# Patient Record
Sex: Female | Born: 1945 | Race: White | Hispanic: No | Marital: Single | Smoking: Never smoker
Health system: Southern US, Community
[De-identification: ages and names within clinical notes are randomized; demographics above are authoritative.]

## PROBLEM LIST (undated history)

## (undated) DIAGNOSIS — I10 Essential (primary) hypertension: Secondary | ICD-10-CM

## (undated) DIAGNOSIS — E039 Hypothyroidism, unspecified: Secondary | ICD-10-CM

## (undated) DIAGNOSIS — E119 Type 2 diabetes mellitus without complications: Secondary | ICD-10-CM

## (undated) DIAGNOSIS — E785 Hyperlipidemia, unspecified: Secondary | ICD-10-CM

## (undated) DIAGNOSIS — M8580 Other specified disorders of bone density and structure, unspecified site: Secondary | ICD-10-CM

## (undated) HISTORY — DX: Other specified disorders of bone density and structure, unspecified site: M85.80

## (undated) HISTORY — DX: Essential (primary) hypertension: I10

## (undated) HISTORY — DX: Hypothyroidism, unspecified: E03.9

## (undated) HISTORY — DX: Type 2 diabetes mellitus without complications: E11.9

## (undated) HISTORY — DX: Hyperlipidemia, unspecified: E78.5

## (undated) HISTORY — PX: TUBAL LIGATION: SHX77

---

## 1997-10-15 ENCOUNTER — Ambulatory Visit (HOSPITAL_COMMUNITY): Admission: RE | Admit: 1997-10-15 | Discharge: 1997-10-15 | Payer: Self-pay | Admitting: Obstetrics and Gynecology

## 1998-04-22 ENCOUNTER — Ambulatory Visit (HOSPITAL_COMMUNITY): Admission: RE | Admit: 1998-04-22 | Discharge: 1998-04-22 | Payer: Self-pay

## 1999-04-28 ENCOUNTER — Ambulatory Visit (HOSPITAL_COMMUNITY): Admission: RE | Admit: 1999-04-28 | Discharge: 1999-04-28 | Payer: Self-pay | Admitting: *Deleted

## 2000-04-29 ENCOUNTER — Ambulatory Visit (HOSPITAL_COMMUNITY): Admission: RE | Admit: 2000-04-29 | Discharge: 2000-04-29 | Payer: Self-pay

## 2001-05-02 ENCOUNTER — Ambulatory Visit (HOSPITAL_COMMUNITY): Admission: RE | Admit: 2001-05-02 | Discharge: 2001-05-02 | Payer: Self-pay | Admitting: Obstetrics and Gynecology

## 2001-05-02 ENCOUNTER — Encounter: Payer: Self-pay | Admitting: Obstetrics and Gynecology

## 2002-05-08 ENCOUNTER — Ambulatory Visit (HOSPITAL_COMMUNITY): Admission: RE | Admit: 2002-05-08 | Discharge: 2002-05-08 | Payer: Self-pay | Admitting: Obstetrics and Gynecology

## 2002-05-08 ENCOUNTER — Encounter: Payer: Self-pay | Admitting: Obstetrics and Gynecology

## 2003-05-21 ENCOUNTER — Ambulatory Visit (HOSPITAL_COMMUNITY): Admission: RE | Admit: 2003-05-21 | Discharge: 2003-05-21 | Payer: Self-pay | Admitting: Obstetrics and Gynecology

## 2004-05-22 ENCOUNTER — Ambulatory Visit (HOSPITAL_COMMUNITY): Admission: RE | Admit: 2004-05-22 | Discharge: 2004-05-22 | Payer: Self-pay | Admitting: Obstetrics and Gynecology

## 2005-06-05 ENCOUNTER — Ambulatory Visit (HOSPITAL_COMMUNITY): Admission: RE | Admit: 2005-06-05 | Discharge: 2005-06-05 | Payer: Self-pay

## 2006-02-13 ENCOUNTER — Ambulatory Visit (HOSPITAL_BASED_OUTPATIENT_CLINIC_OR_DEPARTMENT_OTHER): Admission: RE | Admit: 2006-02-13 | Discharge: 2006-02-13 | Payer: Self-pay | Admitting: Orthopedic Surgery

## 2006-02-13 ENCOUNTER — Encounter (INDEPENDENT_AMBULATORY_CARE_PROVIDER_SITE_OTHER): Payer: Self-pay | Admitting: Specialist

## 2006-06-14 ENCOUNTER — Ambulatory Visit (HOSPITAL_COMMUNITY): Admission: RE | Admit: 2006-06-14 | Discharge: 2006-06-14 | Payer: Self-pay | Admitting: Obstetrics & Gynecology

## 2007-06-16 ENCOUNTER — Ambulatory Visit (HOSPITAL_COMMUNITY): Admission: RE | Admit: 2007-06-16 | Discharge: 2007-06-16 | Payer: Self-pay | Admitting: Family Medicine

## 2008-06-17 ENCOUNTER — Ambulatory Visit (HOSPITAL_COMMUNITY): Admission: RE | Admit: 2008-06-17 | Discharge: 2008-06-17 | Payer: Self-pay | Admitting: Family Medicine

## 2009-06-21 ENCOUNTER — Ambulatory Visit (HOSPITAL_COMMUNITY): Admission: RE | Admit: 2009-06-21 | Discharge: 2009-06-21 | Payer: Self-pay | Admitting: Family Medicine

## 2010-06-27 ENCOUNTER — Ambulatory Visit (HOSPITAL_COMMUNITY)
Admission: RE | Admit: 2010-06-27 | Discharge: 2010-06-27 | Payer: Self-pay | Source: Home / Self Care | Attending: Family Medicine | Admitting: Family Medicine

## 2010-11-17 NOTE — Op Note (Signed)
NAMESEIRA, CODY                 ACCOUNT NO.:  [id number]   MEDICAL RECORD NO.:  [id number]          PATIENT TYPE:  AMB   LOCATION:  DSC                          FACILITY:  [facility]   PHYSICIAN:  Cindee Salt, M.D.       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   OPERATION:  Excisoin of cyst left ring finger with debridement proximal  interphalangeal joint, debridement proximal interphalangeal joint  osteophytes left middle finger.   SURGEON:  Cindee Salt, M.D.   ASSISTANT:  R.N.   ANESTHESIA:  General.   DATE OF OPERATION:  February 13, 2006.   HISTORY:  The patient is a 65 year old female with a history of mass over  the PIP joints of each of the two fingers.  The middle finger has decreased  in size.  The ring finger remains quite large.  She is desirous of removal,  being well aware of degenerative changes, possibility of recurrence,  stiffness, infection.  In the preoperative area, questions are encouraged  and answered.  The area of incision marked by both patient and surgeon.   PROCEDURE:  The patient was brought to the operating room where a general  anesthetic was carried out without difficulty.  She was prepped using  Duraprep in supine position, left arm free.  After 3-minute dry time, she  was draped.  The limb was exsanguinated with an Esmarch bandage.  Tourniquet  placed on the upper arm was inflated 250 mmHg.  A straight incision was made  longitudinally over the PIP joint of her left ring finger, carried down  through subcutaneous tissue.  Bleeders were electrocauterized.  A cystic  mass was immediately encountered.  With blunt and  sharp dissection, this was  dissected free, found to arise from a hole in the extensor tendon to the  radial side of the central slip.  This area was opened.  A small osteophyte  was found to protrude through the area of defect.  This was removed with  rongeur.  A synovectomy of the joint was performed.  No further lesions were  identified.  The wound was irrigated.  The extensor tendon was repaired with  a running 5-0 chromic suture.  The skin was repaired with interrupted 5-0  nylon sutures.  Similarly, a straight incision was then made over the middle  finger, carried down through subcutaneous tissue.  Again, bleeders were  electrocauterized with bipolar.  No discreet cyst was found but attention of  the extensor was noted.  An area of probable deflation of the cyst was also  noted.  The extensor was then opened on its radial aspect between the  lumbrical and central slip.  A large osteophyte was present on the radial  condyle.  This was removed and smoothed with a  rongeur.  A small osteophyte  was present on the ulnar condyle.  This was also removed.  This was sent to  pathology.  The remainder of the cartilage appeared intact.  The wound was  irrigated.  The extensor tendon incision was repaired with a running 5-0  chromic suture and the skin  with interrupted 5-0 nylon sutures.  Sterile compressive dressing and splint  to each finger applied.  The patient tolerated the procedure well and was  taken to the recovery room for observation in satisfactory condition.  She  is discharged home to return to the [facility] in one week on  Vicodin.           ______________________________  Cindee Salt, M.D.     GK/MEDQ  D:  02/13/2006  T:  02/13/2006  Job:  [id number]

## 2011-05-28 ENCOUNTER — Other Ambulatory Visit (HOSPITAL_COMMUNITY): Payer: Self-pay | Admitting: Family Medicine

## 2011-05-28 DIAGNOSIS — Z1231 Encounter for screening mammogram for malignant neoplasm of breast: Secondary | ICD-10-CM

## 2011-07-02 ENCOUNTER — Ambulatory Visit (HOSPITAL_COMMUNITY)
Admission: RE | Admit: 2011-07-02 | Discharge: 2011-07-02 | Disposition: A | Discharge status: Home / Self Care | Payer: BC Managed Care – PPO | Source: Ambulatory Visit | Attending: Family Medicine | Admitting: Family Medicine

## 2011-07-02 DIAGNOSIS — Z1231 Encounter for screening mammogram for malignant neoplasm of breast: Secondary | ICD-10-CM

## 2012-02-07 ENCOUNTER — Other Ambulatory Visit (HOSPITAL_COMMUNITY): Payer: Self-pay | Admitting: Family Medicine

## 2012-02-07 DIAGNOSIS — N959 Unspecified menopausal and perimenopausal disorder: Secondary | ICD-10-CM

## 2012-02-07 DIAGNOSIS — Z1231 Encounter for screening mammogram for malignant neoplasm of breast: Secondary | ICD-10-CM

## 2012-07-03 ENCOUNTER — Ambulatory Visit (HOSPITAL_COMMUNITY)
Admission: RE | Admit: 2012-07-03 | Discharge: 2012-07-03 | Disposition: A | Discharge status: Home / Self Care | Payer: Medicare Other | Source: Ambulatory Visit | Attending: Family Medicine | Admitting: Family Medicine

## 2012-07-03 DIAGNOSIS — Z13820 Encounter for screening for osteoporosis: Secondary | ICD-10-CM | POA: Insufficient documentation

## 2012-07-03 DIAGNOSIS — Z780 Asymptomatic menopausal state: Secondary | ICD-10-CM | POA: Insufficient documentation

## 2012-07-03 DIAGNOSIS — Z1231 Encounter for screening mammogram for malignant neoplasm of breast: Secondary | ICD-10-CM | POA: Insufficient documentation

## 2012-07-03 DIAGNOSIS — N959 Unspecified menopausal and perimenopausal disorder: Secondary | ICD-10-CM

## 2013-06-02 ENCOUNTER — Other Ambulatory Visit (HOSPITAL_COMMUNITY): Payer: Self-pay | Admitting: Family Medicine

## 2013-06-02 DIAGNOSIS — Z1231 Encounter for screening mammogram for malignant neoplasm of breast: Secondary | ICD-10-CM

## 2013-07-06 ENCOUNTER — Ambulatory Visit (HOSPITAL_COMMUNITY)
Admission: RE | Admit: 2013-07-06 | Discharge: 2013-07-06 | Disposition: A | Discharge status: Home / Self Care | Payer: Medicare HMO | Source: Ambulatory Visit | Attending: Family Medicine | Admitting: Family Medicine

## 2013-07-06 DIAGNOSIS — Z1231 Encounter for screening mammogram for malignant neoplasm of breast: Secondary | ICD-10-CM

## 2014-06-07 ENCOUNTER — Other Ambulatory Visit (HOSPITAL_COMMUNITY): Payer: Self-pay | Admitting: Family Medicine

## 2014-06-07 DIAGNOSIS — Z1231 Encounter for screening mammogram for malignant neoplasm of breast: Secondary | ICD-10-CM

## 2014-07-08 ENCOUNTER — Ambulatory Visit (HOSPITAL_COMMUNITY)
Admission: RE | Admit: 2014-07-08 | Discharge: 2014-07-08 | Disposition: A | Discharge status: Home / Self Care | Payer: Medicare HMO | Source: Ambulatory Visit | Attending: Family Medicine | Admitting: Family Medicine

## 2014-07-08 DIAGNOSIS — Z1231 Encounter for screening mammogram for malignant neoplasm of breast: Secondary | ICD-10-CM | POA: Diagnosis not present

## 2015-07-13 ENCOUNTER — Other Ambulatory Visit: Payer: Self-pay

## 2015-07-13 DIAGNOSIS — Z1231 Encounter for screening mammogram for malignant neoplasm of breast: Secondary | ICD-10-CM

## 2015-07-14 ENCOUNTER — Ambulatory Visit
Admission: RE | Admit: 2015-07-14 | Discharge: 2015-07-14 | Disposition: A | Discharge status: Home / Self Care | Payer: PPO | Source: Ambulatory Visit

## 2015-07-14 DIAGNOSIS — Z1231 Encounter for screening mammogram for malignant neoplasm of breast: Secondary | ICD-10-CM

## 2015-10-11 DIAGNOSIS — E119 Type 2 diabetes mellitus without complications: Secondary | ICD-10-CM | POA: Diagnosis not present

## 2015-10-11 DIAGNOSIS — E785 Hyperlipidemia, unspecified: Secondary | ICD-10-CM | POA: Diagnosis not present

## 2015-10-11 DIAGNOSIS — Z298 Encounter for other specified prophylactic measures: Secondary | ICD-10-CM | POA: Diagnosis not present

## 2015-10-11 DIAGNOSIS — E039 Hypothyroidism, unspecified: Secondary | ICD-10-CM | POA: Diagnosis not present

## 2016-06-01 DIAGNOSIS — H5203 Hypermetropia, bilateral: Secondary | ICD-10-CM | POA: Diagnosis not present

## 2016-06-01 DIAGNOSIS — H2511 Age-related nuclear cataract, right eye: Secondary | ICD-10-CM | POA: Diagnosis not present

## 2016-06-11 DIAGNOSIS — R221 Localized swelling, mass and lump, neck: Secondary | ICD-10-CM | POA: Diagnosis not present

## 2016-06-11 DIAGNOSIS — L0211 Cutaneous abscess of neck: Secondary | ICD-10-CM | POA: Diagnosis not present

## 2016-06-11 DIAGNOSIS — Z23 Encounter for immunization: Secondary | ICD-10-CM | POA: Diagnosis not present

## 2016-06-11 DIAGNOSIS — E119 Type 2 diabetes mellitus without complications: Secondary | ICD-10-CM | POA: Diagnosis not present

## 2016-06-20 DIAGNOSIS — N179 Acute kidney failure, unspecified: Secondary | ICD-10-CM | POA: Diagnosis not present

## 2016-06-20 DIAGNOSIS — E119 Type 2 diabetes mellitus without complications: Secondary | ICD-10-CM | POA: Diagnosis not present

## 2016-06-22 DIAGNOSIS — L02215 Cutaneous abscess of perineum: Secondary | ICD-10-CM | POA: Diagnosis not present

## 2016-06-22 DIAGNOSIS — E119 Type 2 diabetes mellitus without complications: Secondary | ICD-10-CM | POA: Diagnosis not present

## 2016-07-24 ENCOUNTER — Other Ambulatory Visit: Payer: Self-pay | Admitting: Family Medicine

## 2016-07-24 DIAGNOSIS — Z1231 Encounter for screening mammogram for malignant neoplasm of breast: Secondary | ICD-10-CM

## 2016-08-01 ENCOUNTER — Ambulatory Visit
Admission: RE | Admit: 2016-08-01 | Discharge: 2016-08-01 | Disposition: A | Discharge status: Home / Self Care | Payer: PPO | Source: Ambulatory Visit | Attending: Family Medicine | Admitting: Family Medicine

## 2016-08-01 DIAGNOSIS — Z1231 Encounter for screening mammogram for malignant neoplasm of breast: Secondary | ICD-10-CM

## 2016-10-12 DIAGNOSIS — E039 Hypothyroidism, unspecified: Secondary | ICD-10-CM | POA: Diagnosis not present

## 2016-10-12 DIAGNOSIS — Z0000 Encounter for general adult medical examination without abnormal findings: Secondary | ICD-10-CM | POA: Diagnosis not present

## 2016-10-12 DIAGNOSIS — E119 Type 2 diabetes mellitus without complications: Secondary | ICD-10-CM | POA: Diagnosis not present

## 2016-11-09 DIAGNOSIS — E119 Type 2 diabetes mellitus without complications: Secondary | ICD-10-CM | POA: Diagnosis not present

## 2016-11-09 DIAGNOSIS — F419 Anxiety disorder, unspecified: Secondary | ICD-10-CM | POA: Diagnosis not present

## 2016-11-09 DIAGNOSIS — F450 Somatization disorder: Secondary | ICD-10-CM | POA: Diagnosis not present

## 2016-11-09 DIAGNOSIS — Z23 Encounter for immunization: Secondary | ICD-10-CM | POA: Diagnosis not present

## 2016-11-09 DIAGNOSIS — E039 Hypothyroidism, unspecified: Secondary | ICD-10-CM | POA: Diagnosis not present

## 2016-11-09 DIAGNOSIS — E785 Hyperlipidemia, unspecified: Secondary | ICD-10-CM | POA: Diagnosis not present

## 2016-11-09 DIAGNOSIS — I10 Essential (primary) hypertension: Secondary | ICD-10-CM | POA: Diagnosis not present

## 2016-12-17 DIAGNOSIS — H00012 Hordeolum externum right lower eyelid: Secondary | ICD-10-CM | POA: Diagnosis not present

## 2016-12-25 DIAGNOSIS — H00012 Hordeolum externum right lower eyelid: Secondary | ICD-10-CM | POA: Diagnosis not present

## 2017-01-10 DIAGNOSIS — E039 Hypothyroidism, unspecified: Secondary | ICD-10-CM | POA: Diagnosis not present

## 2017-01-10 DIAGNOSIS — I10 Essential (primary) hypertension: Secondary | ICD-10-CM | POA: Diagnosis not present

## 2017-01-10 DIAGNOSIS — E119 Type 2 diabetes mellitus without complications: Secondary | ICD-10-CM | POA: Diagnosis not present

## 2017-01-10 DIAGNOSIS — E785 Hyperlipidemia, unspecified: Secondary | ICD-10-CM | POA: Diagnosis not present

## 2017-02-28 DIAGNOSIS — I10 Essential (primary) hypertension: Secondary | ICD-10-CM | POA: Diagnosis not present

## 2017-02-28 DIAGNOSIS — Z6824 Body mass index (BMI) 24.0-24.9, adult: Secondary | ICD-10-CM | POA: Diagnosis not present

## 2017-02-28 DIAGNOSIS — E1169 Type 2 diabetes mellitus with other specified complication: Secondary | ICD-10-CM | POA: Diagnosis not present

## 2017-02-28 DIAGNOSIS — E7800 Pure hypercholesterolemia, unspecified: Secondary | ICD-10-CM | POA: Diagnosis not present

## 2017-02-28 DIAGNOSIS — E039 Hypothyroidism, unspecified: Secondary | ICD-10-CM | POA: Diagnosis not present

## 2017-02-28 DIAGNOSIS — Z9181 History of falling: Secondary | ICD-10-CM | POA: Diagnosis not present

## 2017-04-02 DIAGNOSIS — Z6824 Body mass index (BMI) 24.0-24.9, adult: Secondary | ICD-10-CM | POA: Diagnosis not present

## 2017-04-02 DIAGNOSIS — I10 Essential (primary) hypertension: Secondary | ICD-10-CM | POA: Diagnosis not present

## 2017-04-02 DIAGNOSIS — E1169 Type 2 diabetes mellitus with other specified complication: Secondary | ICD-10-CM | POA: Diagnosis not present

## 2017-04-02 DIAGNOSIS — E039 Hypothyroidism, unspecified: Secondary | ICD-10-CM | POA: Diagnosis not present

## 2017-04-02 DIAGNOSIS — E785 Hyperlipidemia, unspecified: Secondary | ICD-10-CM | POA: Diagnosis not present

## 2017-04-02 DIAGNOSIS — Z79899 Other long term (current) drug therapy: Secondary | ICD-10-CM | POA: Diagnosis not present

## 2017-04-02 DIAGNOSIS — Z0000 Encounter for general adult medical examination without abnormal findings: Secondary | ICD-10-CM | POA: Diagnosis not present

## 2017-04-02 DIAGNOSIS — Z1159 Encounter for screening for other viral diseases: Secondary | ICD-10-CM | POA: Diagnosis not present

## 2017-06-20 DIAGNOSIS — Z6824 Body mass index (BMI) 24.0-24.9, adult: Secondary | ICD-10-CM | POA: Diagnosis not present

## 2017-06-20 DIAGNOSIS — I10 Essential (primary) hypertension: Secondary | ICD-10-CM | POA: Diagnosis not present

## 2017-06-20 DIAGNOSIS — E039 Hypothyroidism, unspecified: Secondary | ICD-10-CM | POA: Diagnosis not present

## 2017-06-20 DIAGNOSIS — E785 Hyperlipidemia, unspecified: Secondary | ICD-10-CM | POA: Diagnosis not present

## 2017-06-20 DIAGNOSIS — Z1339 Encounter for screening examination for other mental health and behavioral disorders: Secondary | ICD-10-CM | POA: Diagnosis not present

## 2017-06-20 DIAGNOSIS — Z79899 Other long term (current) drug therapy: Secondary | ICD-10-CM | POA: Diagnosis not present

## 2017-06-20 DIAGNOSIS — E1169 Type 2 diabetes mellitus with other specified complication: Secondary | ICD-10-CM | POA: Diagnosis not present

## 2017-06-20 DIAGNOSIS — J029 Acute pharyngitis, unspecified: Secondary | ICD-10-CM | POA: Diagnosis not present

## 2017-07-31 ENCOUNTER — Other Ambulatory Visit: Payer: Self-pay | Admitting: Family Medicine

## 2017-07-31 DIAGNOSIS — Z1231 Encounter for screening mammogram for malignant neoplasm of breast: Secondary | ICD-10-CM

## 2017-08-22 ENCOUNTER — Ambulatory Visit: Payer: PPO

## 2017-09-01 DIAGNOSIS — S60222A Contusion of left hand, initial encounter: Secondary | ICD-10-CM | POA: Diagnosis not present

## 2017-09-01 DIAGNOSIS — M25532 Pain in left wrist: Secondary | ICD-10-CM | POA: Diagnosis not present

## 2017-09-01 DIAGNOSIS — S60212A Contusion of left wrist, initial encounter: Secondary | ICD-10-CM | POA: Diagnosis not present

## 2017-09-06 ENCOUNTER — Ambulatory Visit: Payer: PPO

## 2017-09-06 ENCOUNTER — Ambulatory Visit
Admission: RE | Admit: 2017-09-06 | Discharge: 2017-09-06 | Disposition: A | Discharge status: Home / Self Care | Payer: PPO | Source: Ambulatory Visit | Attending: Family Medicine | Admitting: Family Medicine

## 2017-09-06 DIAGNOSIS — Z1231 Encounter for screening mammogram for malignant neoplasm of breast: Secondary | ICD-10-CM | POA: Diagnosis not present

## 2017-09-09 DIAGNOSIS — H2511 Age-related nuclear cataract, right eye: Secondary | ICD-10-CM | POA: Diagnosis not present

## 2017-09-09 DIAGNOSIS — H40003 Preglaucoma, unspecified, bilateral: Secondary | ICD-10-CM | POA: Diagnosis not present

## 2017-09-09 DIAGNOSIS — H5202 Hypermetropia, left eye: Secondary | ICD-10-CM | POA: Diagnosis not present

## 2017-10-01 DIAGNOSIS — E7800 Pure hypercholesterolemia, unspecified: Secondary | ICD-10-CM | POA: Diagnosis not present

## 2017-10-01 DIAGNOSIS — E1169 Type 2 diabetes mellitus with other specified complication: Secondary | ICD-10-CM | POA: Diagnosis not present

## 2017-10-01 DIAGNOSIS — Z6825 Body mass index (BMI) 25.0-25.9, adult: Secondary | ICD-10-CM | POA: Diagnosis not present

## 2017-10-01 DIAGNOSIS — E039 Hypothyroidism, unspecified: Secondary | ICD-10-CM | POA: Diagnosis not present

## 2017-10-01 DIAGNOSIS — Z79899 Other long term (current) drug therapy: Secondary | ICD-10-CM | POA: Diagnosis not present

## 2018-01-08 DIAGNOSIS — K529 Noninfective gastroenteritis and colitis, unspecified: Secondary | ICD-10-CM | POA: Diagnosis not present

## 2018-01-08 DIAGNOSIS — E119 Type 2 diabetes mellitus without complications: Secondary | ICD-10-CM | POA: Diagnosis not present

## 2018-01-08 DIAGNOSIS — R197 Diarrhea, unspecified: Secondary | ICD-10-CM | POA: Diagnosis not present

## 2018-01-08 DIAGNOSIS — E1165 Type 2 diabetes mellitus with hyperglycemia: Secondary | ICD-10-CM | POA: Diagnosis not present

## 2018-01-08 DIAGNOSIS — M549 Dorsalgia, unspecified: Secondary | ICD-10-CM | POA: Diagnosis not present

## 2018-03-28 DIAGNOSIS — M25561 Pain in right knee: Secondary | ICD-10-CM | POA: Diagnosis not present

## 2018-04-16 DIAGNOSIS — T63441A Toxic effect of venom of bees, accidental (unintentional), initial encounter: Secondary | ICD-10-CM | POA: Diagnosis not present

## 2018-04-28 DIAGNOSIS — E785 Hyperlipidemia, unspecified: Secondary | ICD-10-CM | POA: Diagnosis not present

## 2018-04-28 DIAGNOSIS — I10 Essential (primary) hypertension: Secondary | ICD-10-CM | POA: Diagnosis not present

## 2018-04-28 DIAGNOSIS — Z7689 Persons encountering health services in other specified circumstances: Secondary | ICD-10-CM | POA: Diagnosis not present

## 2018-04-28 DIAGNOSIS — E039 Hypothyroidism, unspecified: Secondary | ICD-10-CM | POA: Diagnosis not present

## 2018-04-28 DIAGNOSIS — J302 Other seasonal allergic rhinitis: Secondary | ICD-10-CM | POA: Diagnosis not present

## 2018-04-28 DIAGNOSIS — Z0000 Encounter for general adult medical examination without abnormal findings: Secondary | ICD-10-CM | POA: Diagnosis not present

## 2018-04-28 DIAGNOSIS — E119 Type 2 diabetes mellitus without complications: Secondary | ICD-10-CM | POA: Diagnosis not present

## 2018-05-07 DIAGNOSIS — L03114 Cellulitis of left upper limb: Secondary | ICD-10-CM | POA: Diagnosis not present

## 2018-05-07 DIAGNOSIS — Z7984 Long term (current) use of oral hypoglycemic drugs: Secondary | ICD-10-CM | POA: Diagnosis not present

## 2018-05-07 DIAGNOSIS — W57XXXA Bitten or stung by nonvenomous insect and other nonvenomous arthropods, initial encounter: Secondary | ICD-10-CM | POA: Diagnosis not present

## 2018-05-07 DIAGNOSIS — E119 Type 2 diabetes mellitus without complications: Secondary | ICD-10-CM | POA: Diagnosis not present

## 2018-05-07 DIAGNOSIS — R82998 Other abnormal findings in urine: Secondary | ICD-10-CM | POA: Diagnosis not present

## 2018-05-07 DIAGNOSIS — E785 Hyperlipidemia, unspecified: Secondary | ICD-10-CM | POA: Diagnosis not present

## 2018-08-07 DIAGNOSIS — E119 Type 2 diabetes mellitus without complications: Secondary | ICD-10-CM | POA: Diagnosis not present

## 2018-08-07 DIAGNOSIS — I10 Essential (primary) hypertension: Secondary | ICD-10-CM | POA: Diagnosis not present

## 2018-08-07 DIAGNOSIS — E039 Hypothyroidism, unspecified: Secondary | ICD-10-CM | POA: Diagnosis not present

## 2018-08-07 DIAGNOSIS — Z1321 Encounter for screening for nutritional disorder: Secondary | ICD-10-CM | POA: Diagnosis not present

## 2018-08-07 DIAGNOSIS — Z79899 Other long term (current) drug therapy: Secondary | ICD-10-CM | POA: Diagnosis not present

## 2018-08-14 ENCOUNTER — Other Ambulatory Visit: Payer: Self-pay | Admitting: General Practice

## 2018-08-14 DIAGNOSIS — Z1231 Encounter for screening mammogram for malignant neoplasm of breast: Secondary | ICD-10-CM

## 2018-09-03 DIAGNOSIS — K644 Residual hemorrhoidal skin tags: Secondary | ICD-10-CM | POA: Diagnosis not present

## 2018-09-17 ENCOUNTER — Ambulatory Visit
Admission: RE | Admit: 2018-09-17 | Discharge: 2018-09-17 | Disposition: A | Discharge status: Home / Self Care | Payer: PPO | Source: Ambulatory Visit | Attending: General Practice | Admitting: General Practice

## 2018-09-17 ENCOUNTER — Other Ambulatory Visit: Payer: Self-pay

## 2018-09-17 DIAGNOSIS — Z1231 Encounter for screening mammogram for malignant neoplasm of breast: Secondary | ICD-10-CM | POA: Diagnosis not present

## 2018-12-03 DIAGNOSIS — Z7689 Persons encountering health services in other specified circumstances: Secondary | ICD-10-CM | POA: Diagnosis not present

## 2018-12-03 DIAGNOSIS — E039 Hypothyroidism, unspecified: Secondary | ICD-10-CM | POA: Diagnosis not present

## 2018-12-03 DIAGNOSIS — I10 Essential (primary) hypertension: Secondary | ICD-10-CM | POA: Diagnosis not present

## 2018-12-03 DIAGNOSIS — E119 Type 2 diabetes mellitus without complications: Secondary | ICD-10-CM | POA: Diagnosis not present

## 2018-12-03 DIAGNOSIS — Z1321 Encounter for screening for nutritional disorder: Secondary | ICD-10-CM | POA: Diagnosis not present

## 2018-12-03 DIAGNOSIS — E785 Hyperlipidemia, unspecified: Secondary | ICD-10-CM | POA: Diagnosis not present

## 2018-12-03 DIAGNOSIS — Z79899 Other long term (current) drug therapy: Secondary | ICD-10-CM | POA: Diagnosis not present

## 2019-04-09 DIAGNOSIS — I10 Essential (primary) hypertension: Secondary | ICD-10-CM | POA: Diagnosis not present

## 2019-04-09 DIAGNOSIS — E119 Type 2 diabetes mellitus without complications: Secondary | ICD-10-CM | POA: Diagnosis not present

## 2019-04-09 DIAGNOSIS — Z1321 Encounter for screening for nutritional disorder: Secondary | ICD-10-CM | POA: Diagnosis not present

## 2019-04-09 DIAGNOSIS — E039 Hypothyroidism, unspecified: Secondary | ICD-10-CM | POA: Diagnosis not present

## 2019-04-09 DIAGNOSIS — E785 Hyperlipidemia, unspecified: Secondary | ICD-10-CM | POA: Diagnosis not present

## 2019-08-18 ENCOUNTER — Other Ambulatory Visit: Payer: Self-pay | Admitting: General Practice

## 2019-08-18 DIAGNOSIS — Z1231 Encounter for screening mammogram for malignant neoplasm of breast: Secondary | ICD-10-CM

## 2019-09-22 DIAGNOSIS — E119 Type 2 diabetes mellitus without complications: Secondary | ICD-10-CM | POA: Diagnosis not present

## 2019-09-22 DIAGNOSIS — I10 Essential (primary) hypertension: Secondary | ICD-10-CM | POA: Diagnosis not present

## 2019-09-22 DIAGNOSIS — E785 Hyperlipidemia, unspecified: Secondary | ICD-10-CM | POA: Diagnosis not present

## 2019-09-22 DIAGNOSIS — Z0000 Encounter for general adult medical examination without abnormal findings: Secondary | ICD-10-CM | POA: Diagnosis not present

## 2019-09-22 DIAGNOSIS — E039 Hypothyroidism, unspecified: Secondary | ICD-10-CM | POA: Diagnosis not present

## 2019-09-24 ENCOUNTER — Other Ambulatory Visit: Payer: Self-pay

## 2019-09-24 ENCOUNTER — Ambulatory Visit
Admission: RE | Admit: 2019-09-24 | Discharge: 2019-09-24 | Disposition: A | Discharge status: Home / Self Care | Payer: PPO | Source: Ambulatory Visit | Attending: General Practice | Admitting: General Practice

## 2019-09-24 DIAGNOSIS — Z1231 Encounter for screening mammogram for malignant neoplasm of breast: Secondary | ICD-10-CM | POA: Diagnosis not present

## 2020-04-11 DIAGNOSIS — E119 Type 2 diabetes mellitus without complications: Secondary | ICD-10-CM | POA: Diagnosis not present

## 2020-04-11 DIAGNOSIS — I10 Essential (primary) hypertension: Secondary | ICD-10-CM | POA: Diagnosis not present

## 2020-04-11 DIAGNOSIS — E039 Hypothyroidism, unspecified: Secondary | ICD-10-CM | POA: Diagnosis not present

## 2020-04-11 DIAGNOSIS — E785 Hyperlipidemia, unspecified: Secondary | ICD-10-CM | POA: Diagnosis not present

## 2020-08-19 ENCOUNTER — Other Ambulatory Visit: Payer: Self-pay | Admitting: General Practice

## 2020-08-19 DIAGNOSIS — Z1231 Encounter for screening mammogram for malignant neoplasm of breast: Secondary | ICD-10-CM

## 2020-10-07 ENCOUNTER — Ambulatory Visit
Admission: RE | Admit: 2020-10-07 | Discharge: 2020-10-07 | Disposition: A | Discharge status: Home / Self Care | Payer: Medicare HMO | Source: Ambulatory Visit | Attending: General Practice | Admitting: General Practice

## 2020-10-07 ENCOUNTER — Other Ambulatory Visit: Payer: Self-pay

## 2020-10-07 DIAGNOSIS — Z1231 Encounter for screening mammogram for malignant neoplasm of breast: Secondary | ICD-10-CM

## 2020-10-10 DIAGNOSIS — E039 Hypothyroidism, unspecified: Secondary | ICD-10-CM | POA: Diagnosis not present

## 2020-10-10 DIAGNOSIS — E785 Hyperlipidemia, unspecified: Secondary | ICD-10-CM | POA: Diagnosis not present

## 2020-10-10 DIAGNOSIS — E119 Type 2 diabetes mellitus without complications: Secondary | ICD-10-CM | POA: Diagnosis not present

## 2020-10-10 DIAGNOSIS — I10 Essential (primary) hypertension: Secondary | ICD-10-CM | POA: Diagnosis not present

## 2021-01-18 DIAGNOSIS — H25811 Combined forms of age-related cataract, right eye: Secondary | ICD-10-CM | POA: Diagnosis not present

## 2021-01-18 DIAGNOSIS — I10 Essential (primary) hypertension: Secondary | ICD-10-CM | POA: Diagnosis not present

## 2021-01-18 DIAGNOSIS — Z0100 Encounter for examination of eyes and vision without abnormal findings: Secondary | ICD-10-CM | POA: Diagnosis not present

## 2021-01-18 DIAGNOSIS — H353111 Nonexudative age-related macular degeneration, right eye, early dry stage: Secondary | ICD-10-CM | POA: Diagnosis not present

## 2021-01-18 DIAGNOSIS — E119 Type 2 diabetes mellitus without complications: Secondary | ICD-10-CM | POA: Diagnosis not present

## 2021-01-18 DIAGNOSIS — Z7984 Long term (current) use of oral hypoglycemic drugs: Secondary | ICD-10-CM | POA: Diagnosis not present

## 2021-01-18 DIAGNOSIS — H52223 Regular astigmatism, bilateral: Secondary | ICD-10-CM | POA: Diagnosis not present

## 2021-01-18 DIAGNOSIS — H524 Presbyopia: Secondary | ICD-10-CM | POA: Diagnosis not present

## 2021-01-18 DIAGNOSIS — Z9842 Cataract extraction status, left eye: Secondary | ICD-10-CM | POA: Diagnosis not present

## 2021-01-18 DIAGNOSIS — Z961 Presence of intraocular lens: Secondary | ICD-10-CM | POA: Diagnosis not present

## 2021-01-18 DIAGNOSIS — H5203 Hypermetropia, bilateral: Secondary | ICD-10-CM | POA: Diagnosis not present

## 2021-02-07 DIAGNOSIS — E782 Mixed hyperlipidemia: Secondary | ICD-10-CM | POA: Diagnosis not present

## 2021-02-07 DIAGNOSIS — E119 Type 2 diabetes mellitus without complications: Secondary | ICD-10-CM | POA: Diagnosis not present

## 2021-02-07 DIAGNOSIS — I10 Essential (primary) hypertension: Secondary | ICD-10-CM | POA: Diagnosis not present

## 2021-02-07 DIAGNOSIS — E1169 Type 2 diabetes mellitus with other specified complication: Secondary | ICD-10-CM | POA: Diagnosis not present

## 2021-03-14 DIAGNOSIS — Z0000 Encounter for general adult medical examination without abnormal findings: Secondary | ICD-10-CM | POA: Diagnosis not present

## 2021-03-14 DIAGNOSIS — I10 Essential (primary) hypertension: Secondary | ICD-10-CM | POA: Diagnosis not present

## 2021-03-14 DIAGNOSIS — E119 Type 2 diabetes mellitus without complications: Secondary | ICD-10-CM | POA: Diagnosis not present

## 2021-03-14 DIAGNOSIS — E1169 Type 2 diabetes mellitus with other specified complication: Secondary | ICD-10-CM | POA: Diagnosis not present

## 2021-03-14 DIAGNOSIS — Z6824 Body mass index (BMI) 24.0-24.9, adult: Secondary | ICD-10-CM | POA: Diagnosis not present

## 2021-03-14 DIAGNOSIS — E782 Mixed hyperlipidemia: Secondary | ICD-10-CM | POA: Diagnosis not present

## 2021-03-28 DIAGNOSIS — H18413 Arcus senilis, bilateral: Secondary | ICD-10-CM | POA: Diagnosis not present

## 2021-03-28 DIAGNOSIS — Z961 Presence of intraocular lens: Secondary | ICD-10-CM | POA: Diagnosis not present

## 2021-03-28 DIAGNOSIS — H2511 Age-related nuclear cataract, right eye: Secondary | ICD-10-CM | POA: Diagnosis not present

## 2021-03-28 DIAGNOSIS — H353131 Nonexudative age-related macular degeneration, bilateral, early dry stage: Secondary | ICD-10-CM | POA: Diagnosis not present

## 2021-04-03 DIAGNOSIS — H25811 Combined forms of age-related cataract, right eye: Secondary | ICD-10-CM | POA: Diagnosis not present

## 2021-04-03 DIAGNOSIS — H2511 Age-related nuclear cataract, right eye: Secondary | ICD-10-CM | POA: Diagnosis not present

## 2021-06-07 DIAGNOSIS — M810 Age-related osteoporosis without current pathological fracture: Secondary | ICD-10-CM | POA: Diagnosis not present

## 2021-06-07 DIAGNOSIS — M85832 Other specified disorders of bone density and structure, left forearm: Secondary | ICD-10-CM | POA: Diagnosis not present

## 2021-06-13 DIAGNOSIS — E782 Mixed hyperlipidemia: Secondary | ICD-10-CM | POA: Diagnosis not present

## 2021-06-13 DIAGNOSIS — E1169 Type 2 diabetes mellitus with other specified complication: Secondary | ICD-10-CM | POA: Diagnosis not present

## 2021-09-12 ENCOUNTER — Other Ambulatory Visit: Payer: Self-pay | Admitting: Student

## 2021-09-12 DIAGNOSIS — Z1231 Encounter for screening mammogram for malignant neoplasm of breast: Secondary | ICD-10-CM

## 2021-09-25 DIAGNOSIS — E119 Type 2 diabetes mellitus without complications: Secondary | ICD-10-CM | POA: Diagnosis not present

## 2021-09-25 DIAGNOSIS — I10 Essential (primary) hypertension: Secondary | ICD-10-CM | POA: Diagnosis not present

## 2021-09-25 DIAGNOSIS — E039 Hypothyroidism, unspecified: Secondary | ICD-10-CM | POA: Diagnosis not present

## 2021-09-25 DIAGNOSIS — E782 Mixed hyperlipidemia: Secondary | ICD-10-CM | POA: Diagnosis not present

## 2021-09-25 DIAGNOSIS — E1169 Type 2 diabetes mellitus with other specified complication: Secondary | ICD-10-CM | POA: Diagnosis not present

## 2021-10-09 ENCOUNTER — Ambulatory Visit
Admission: RE | Admit: 2021-10-09 | Discharge: 2021-10-09 | Disposition: A | Discharge status: Home / Self Care | Payer: Medicare Other | Source: Ambulatory Visit | Attending: Student | Admitting: Student

## 2021-10-09 DIAGNOSIS — Z1231 Encounter for screening mammogram for malignant neoplasm of breast: Secondary | ICD-10-CM

## 2021-12-28 DIAGNOSIS — I8393 Asymptomatic varicose veins of bilateral lower extremities: Secondary | ICD-10-CM | POA: Diagnosis not present

## 2021-12-28 DIAGNOSIS — E782 Mixed hyperlipidemia: Secondary | ICD-10-CM | POA: Diagnosis not present

## 2021-12-28 DIAGNOSIS — Z6824 Body mass index (BMI) 24.0-24.9, adult: Secondary | ICD-10-CM | POA: Diagnosis not present

## 2021-12-28 DIAGNOSIS — Z0000 Encounter for general adult medical examination without abnormal findings: Secondary | ICD-10-CM | POA: Diagnosis not present

## 2021-12-28 DIAGNOSIS — E1169 Type 2 diabetes mellitus with other specified complication: Secondary | ICD-10-CM | POA: Diagnosis not present

## 2022-04-02 ENCOUNTER — Other Ambulatory Visit: Payer: Self-pay

## 2022-04-02 DIAGNOSIS — E1169 Type 2 diabetes mellitus with other specified complication: Secondary | ICD-10-CM | POA: Diagnosis not present

## 2022-04-02 DIAGNOSIS — E039 Hypothyroidism, unspecified: Secondary | ICD-10-CM | POA: Diagnosis not present

## 2022-04-02 DIAGNOSIS — E782 Mixed hyperlipidemia: Secondary | ICD-10-CM | POA: Diagnosis not present

## 2022-04-03 LAB — HGB A1C W/O EAG: Hgb A1c MFr Bld: 6.5 % — ABNORMAL HIGH (ref 4.8–5.6)

## 2022-04-03 LAB — TSH: TSH: 1.33 u[IU]/mL (ref 0.450–4.500)

## 2022-05-17 DIAGNOSIS — H5203 Hypermetropia, bilateral: Secondary | ICD-10-CM | POA: Diagnosis not present

## 2022-05-17 DIAGNOSIS — E119 Type 2 diabetes mellitus without complications: Secondary | ICD-10-CM | POA: Diagnosis not present

## 2022-05-17 DIAGNOSIS — H52223 Regular astigmatism, bilateral: Secondary | ICD-10-CM | POA: Diagnosis not present

## 2022-05-17 DIAGNOSIS — Z9849 Cataract extraction status, unspecified eye: Secondary | ICD-10-CM | POA: Diagnosis not present

## 2022-05-17 DIAGNOSIS — H524 Presbyopia: Secondary | ICD-10-CM | POA: Diagnosis not present

## 2022-05-25 IMAGING — MG MM DIGITAL SCREENING BILAT W/ TOMO AND CAD
8 series · 9 of 24 positions shown · non-contrast
Comparison: Previous exam(s).

CLINICAL DATA: Screening.

EXAM:
DIGITAL SCREENING BILATERAL MAMMOGRAM WITH TOMOSYNTHESIS AND CAD
TECHNIQUE: Bilateral screening digital craniocaudal and mediolateral oblique
mammograms were obtained. Bilateral screening digital breast
tomosynthesis was performed. The images were evaluated with
computer-aided detection.

[R CC synth-2D]
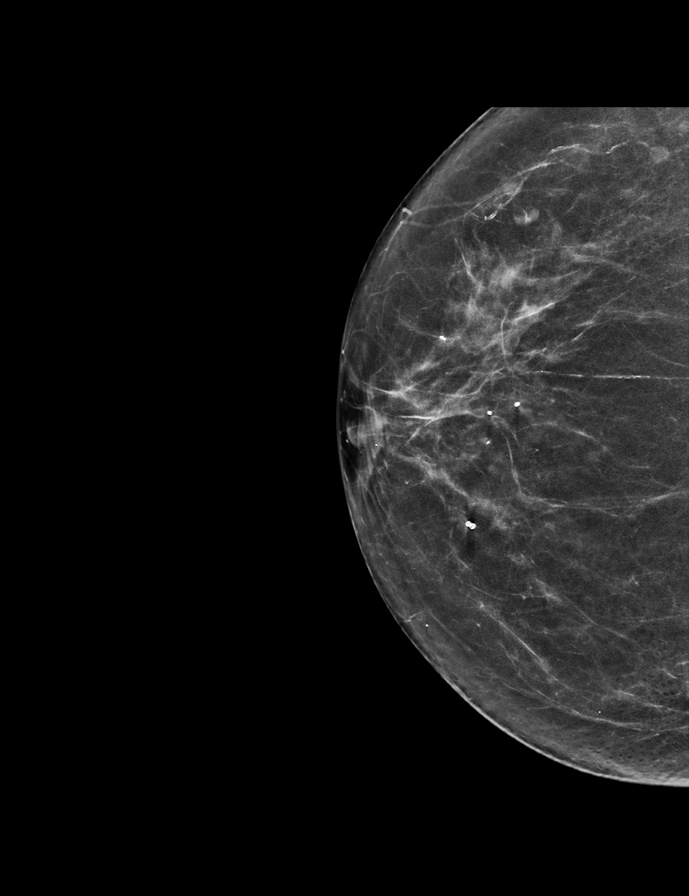

[L MLO synth-2D]
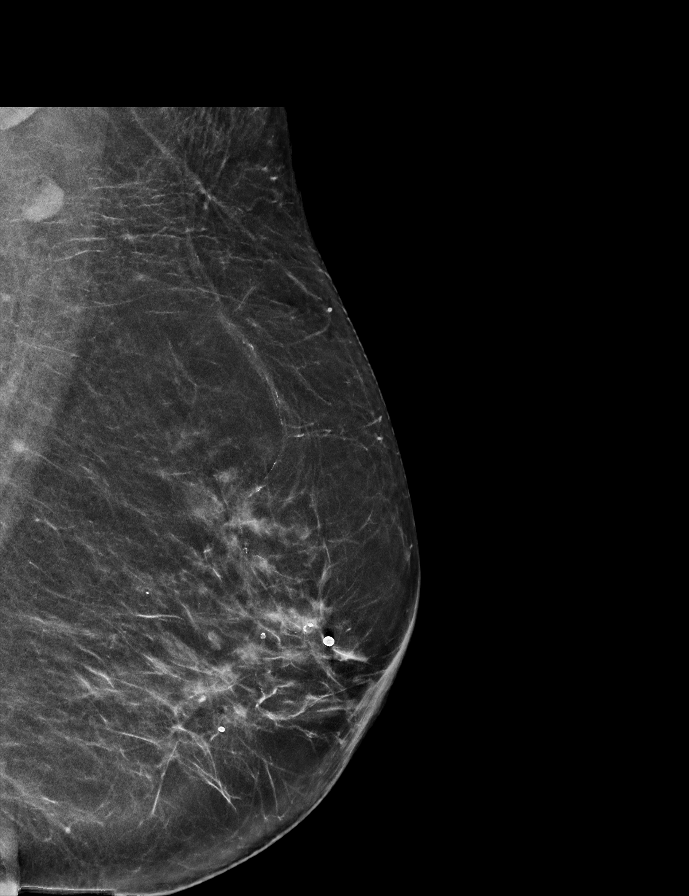

[L CC synth-2D]
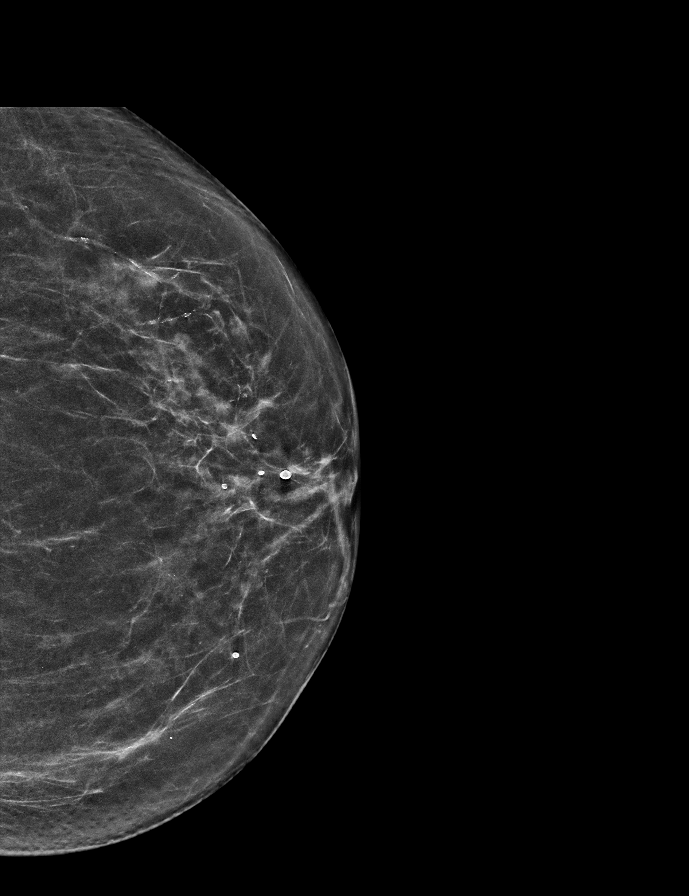

[R MLO synth-2D]
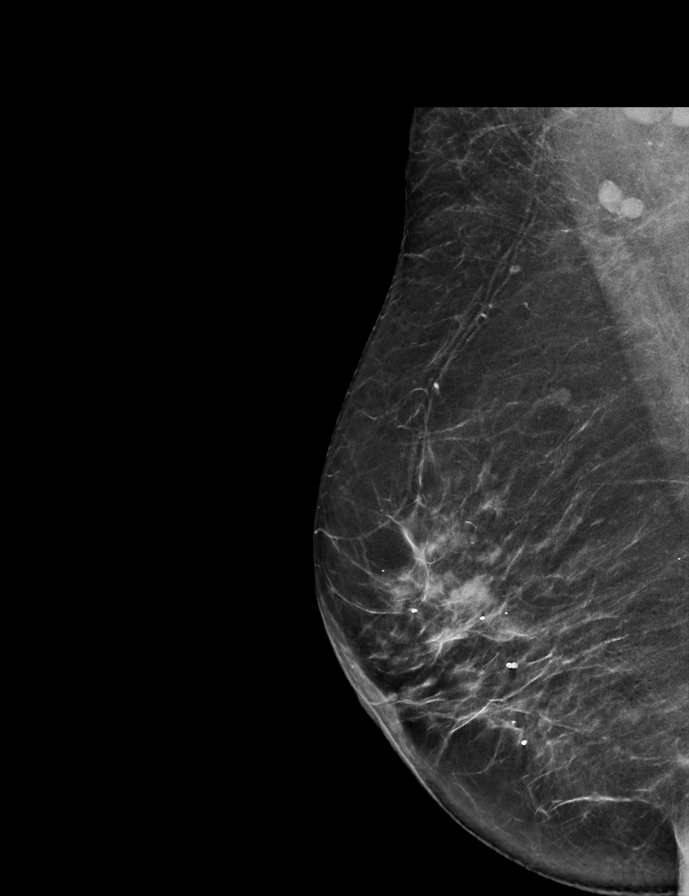

[R CC tomo · 2 of 61 frames shown]
[frame 20/61]
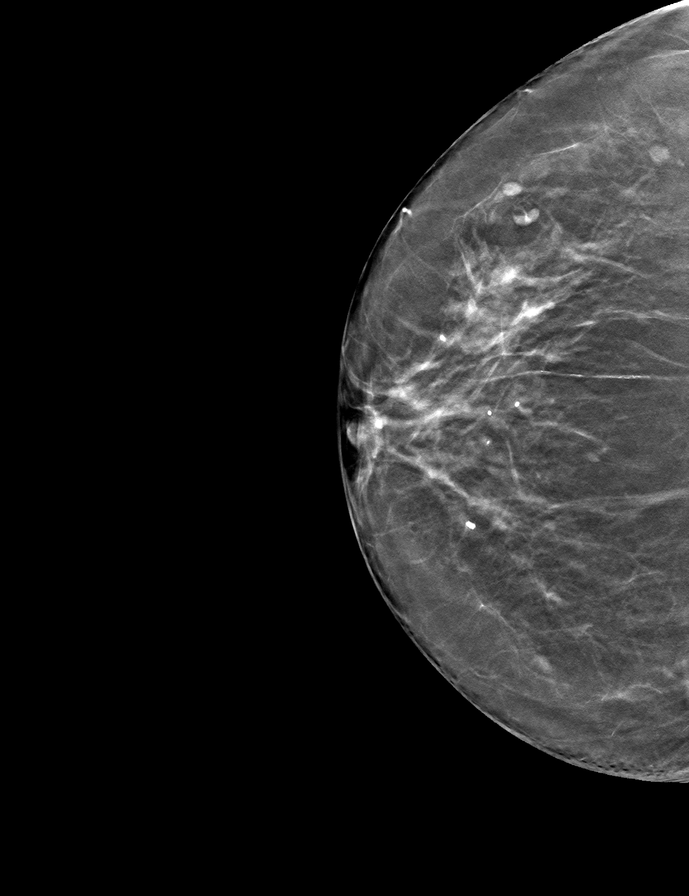
[frame 31/61]
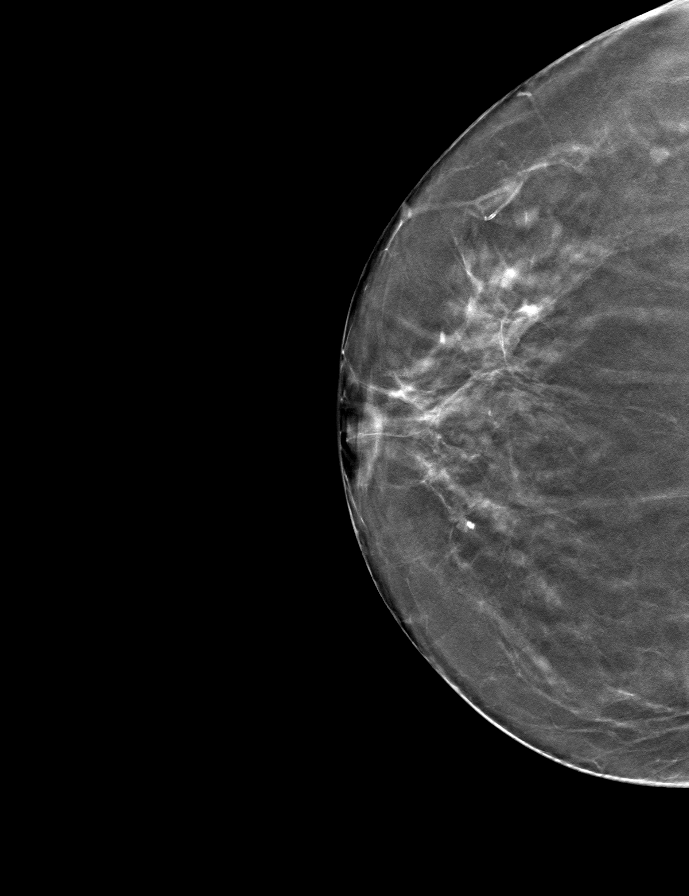

[R MLO tomo · tomo slice 35/68.0]
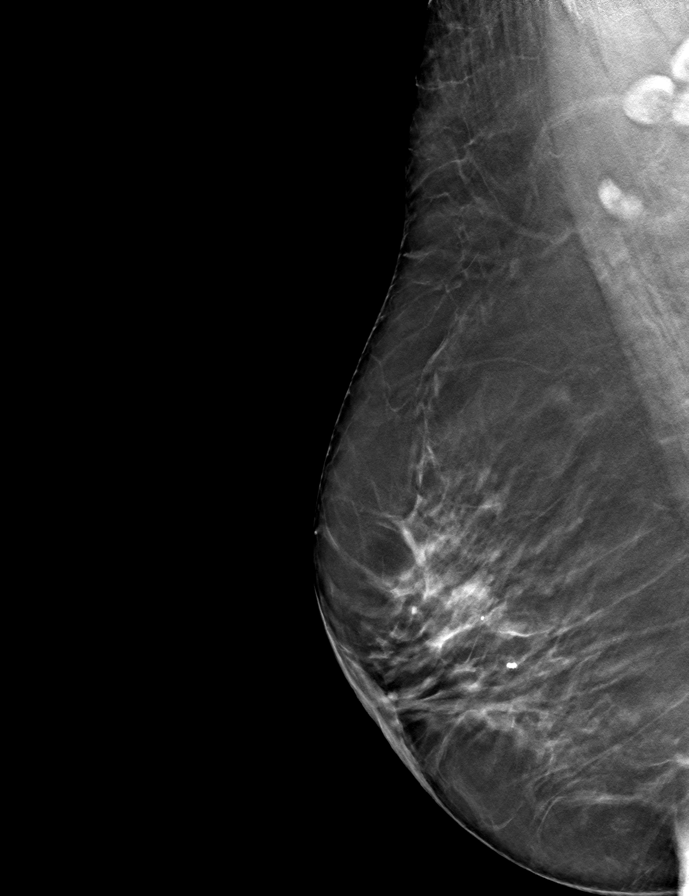

[L MLO tomo · tomo slice 33/64.0]
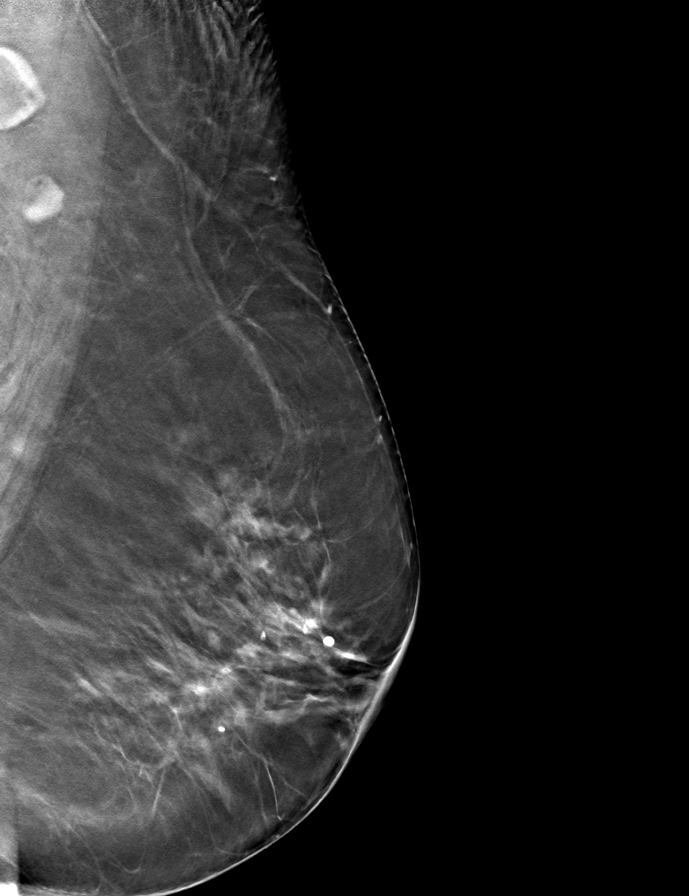

[L CC tomo · tomo slice 30/59.0]
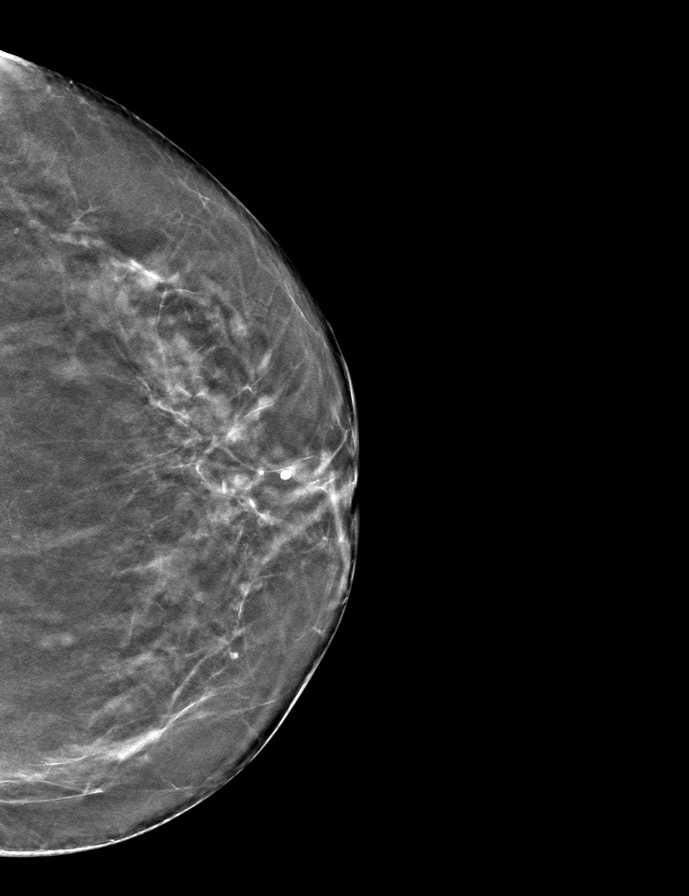

[9 of 24 positions shown; findings below may reference images not displayed]

ACR Breast Density Category b: There are scattered areas of
fibroglandular density.
FINDINGS: There are no findings suspicious for malignancy. The images were
evaluated with computer-aided detection.
IMPRESSION: No mammographic evidence of malignancy. A result letter of this
screening mammogram will be mailed directly to the patient.

RECOMMENDATION:
Screening mammogram in one year. (Code:WJ-I-BG6)

BI-RADS CATEGORY  1: Negative.

## 2022-06-12 ENCOUNTER — Encounter: Payer: Self-pay | Admitting: Internal Medicine

## 2022-06-12 ENCOUNTER — Ambulatory Visit: Payer: Medicare Other | Admitting: Internal Medicine

## 2022-06-12 VITALS — BP 140/72 | HR 92 | Temp 97.5°F | Resp 16 | Ht 65.0 in | Wt 149.0 lb

## 2022-06-12 DIAGNOSIS — E119 Type 2 diabetes mellitus without complications: Secondary | ICD-10-CM

## 2022-06-12 DIAGNOSIS — I10 Essential (primary) hypertension: Secondary | ICD-10-CM

## 2022-06-12 DIAGNOSIS — E7800 Pure hypercholesterolemia, unspecified: Secondary | ICD-10-CM

## 2022-06-12 MED ORDER — PREVNAR 20 0.5 ML IM SUSY: 0.5000 mL | PREFILLED_SYRINGE | INTRAMUSCULAR | 0 refills | Status: AC

## 2022-06-12 NOTE — Assessment & Plan Note (Signed)
She remains on a statin and we will check her FLP on her next visit as well.

## 2022-06-12 NOTE — Progress Notes (Signed)
Office Visit  Subjective   Patient ID: Kim Shepherd   DOB: Nov 15, 1945   Age: 76 y.o.   MRN: [id number]   Chief Complaint Chief Complaint  Patient presents with   Follow-up    Diabetes     History of Present Illness The patient is a 76 year old Caucasian/White female who returns for a follow-up visit for her T2 diabetes.   She believes she was diagnosed with T2 diabetes maybe 10 years ago.  Since the last visit, there have been no problems. She remains on glimepiride 2 mg daily and metformin 1,000 mg BID.  She is walking for exercise.  She specifically denies chest pain, unexplained abdominal pain, nausea or vomiting, and documented hypoglycemia. She does check blood sugars 3 times per week where her sugars range in the 100's.   She came in fasting today in anticipation of lab work. Her last HgbA1c was done 2 months ago and was 6.5%.  She denies any long term complications of diabetes including no diabetic retinopathy, neuropathy, nephropathy or cardiovascular disease.  Her last diabetic dilated eye exam was done on 05/17/2022 and this showed no evidence of retinopathy.  The patient is a 76 year old Caucasian/White female who presents for a follow-up evaluation of hypertension. She was diagnosed with HTN and HCL around 10 years ago.  The patient has been checking her blood pressure at home. The patient's blood pressure has ranged from 140's.  The patient's current medications include: lisinopril 20 mg daily. The patient has been tolerating her medications well. The patient denies any headache, visual changes, dizziness, lightheadness, chest pain, shortness of breath, weakness/numbness, and edema. She reports there have been no other symptoms noted.   Kim Shepherd returns today for routine followup on her cholesterol. Overall, she states she is doing well and is without any complaints or problems at this time. She specifically denies chest pain, abdominal pain, nausea, diarrhea, and myalgias. She  remains on dietary management as well as the following cholesterol lowering medications rosuvastatin 20 mg qhs. She is fasting in anticipation for labs today.      Past Medical History Past Medical History:  Diagnosis Date   Diabetes mellitus, type 2 (HCC)    Dyslipidemia    Essential hypertension    Hypothyroidism      Allergies Allergies  Allergen Reactions   Penicillins Rash   Sulfa Antibiotics Rash     Review of Systems Review of Systems  Constitutional:  Negative for chills and fever.  HENT:  Negative for hearing loss and tinnitus.   Eyes:  Negative for blurred vision and double vision.  Respiratory:  Negative for cough and shortness of breath.   Cardiovascular:  Negative for chest pain, palpitations and leg swelling.  Gastrointestinal:  Negative for abdominal pain, constipation, diarrhea, nausea and vomiting.  Musculoskeletal:  Negative for myalgias.  Skin:  Negative for itching and rash.  Neurological:  Negative for dizziness, weakness and headaches.  Psychiatric/Behavioral:  Negative for depression. The patient is not nervous/anxious.        Objective:    Vitals BP (!) 140/72   Pulse 92   Temp (!) 97.5 F (36.4 C)   Resp 16   Ht 5\' 5"  (1.651 m)   Wt 149 lb 0.2 oz (67.6 kg)   SpO2 97%   BMI 24.80 kg/m    Physical Examination Physical Exam Constitutional:      Appearance: Normal appearance. She is not ill-appearing.  Cardiovascular:  Rate and Rhythm: Normal rate and regular rhythm.     Pulses: Normal pulses.     Heart sounds: Normal heart sounds. No murmur heard.    No friction rub. No gallop.  Pulmonary:     Effort: Pulmonary effort is normal. No respiratory distress.     Breath sounds: Normal breath sounds. No wheezing, rhonchi or rales.  Abdominal:     General: Abdomen is flat. Bowel sounds are normal. There is no distension.     Palpations: Abdomen is soft.     Tenderness: There is no abdominal tenderness.  Musculoskeletal:     Right  lower leg: No edema.     Left lower leg: No edema.  Skin:    General: Skin is warm and dry.     Findings: No rash.  Neurological:     General: No focal deficit present.     Mental Status: She is alert and oriented to person, place, and time.  Psychiatric:        Mood and Affect: Mood normal.        Behavior: Behavior normal.        Assessment & Plan:   Essential hypertension Her BP this past year has run in the 120's systolically.  I am not going to change her meds but we will see what her BP is running on her next visit.  Diabetes mellitus without complication (HCC) Her diabetes has been controlled.  We will continue her current meds and check her A1c on her next visit.  Hypercholesterolemia She remains on a statin and we will check her FLP on her next visit as well.    Return in about 3 months (around 09/11/2022).   Crist Fat, MD

## 2022-06-12 NOTE — Assessment & Plan Note (Signed)
Her BP this past year has run in the 120's systolically.  I am not going to change her meds but we will see what her BP is running on her next visit.

## 2022-06-12 NOTE — Assessment & Plan Note (Signed)
Her diabetes has been controlled.  We will continue her current meds and check her A1c on her next visit.

## 2022-09-07 ENCOUNTER — Encounter: Payer: Self-pay | Admitting: Internal Medicine

## 2022-09-07 ENCOUNTER — Ambulatory Visit: Payer: Medicare Other | Admitting: Internal Medicine

## 2022-09-07 VITALS — BP 158/90 | HR 72 | Temp 98.0°F | Resp 16 | Ht 65.0 in | Wt 148.4 lb

## 2022-09-07 DIAGNOSIS — I10 Essential (primary) hypertension: Secondary | ICD-10-CM

## 2022-09-07 DIAGNOSIS — E7800 Pure hypercholesterolemia, unspecified: Secondary | ICD-10-CM

## 2022-09-07 DIAGNOSIS — E119 Type 2 diabetes mellitus without complications: Secondary | ICD-10-CM

## 2022-09-07 MED ORDER — LISINOPRIL 20 MG PO TABS: 30.0000 mg | ORAL_TABLET | Freq: Every day | ORAL | 3 refills | Status: DC

## 2022-09-07 NOTE — Assessment & Plan Note (Signed)
We will check a HgBA1c on her today with goal <7%.

## 2022-09-07 NOTE — Assessment & Plan Note (Signed)
We will also check her FLP with goal LDL <100.

## 2022-09-07 NOTE — Progress Notes (Signed)
Office Visit  Subjective   Patient ID: Kim Shepherd   DOB: [birth year]   Age: 77 y.o.   MRN: [id number]   Chief Complaint Chief Complaint  Patient presents with   Follow-up    Diabetes     History of Present Illness The patient is a 77 year old Caucasian/White female who returns for a follow-up visit for her T2 diabetes.   She believes she was diagnosed with T2 diabetes maybe 10 years ago.  Since the last visit, there have been no problems. She remains on glimepiride 2 mg daily and metformin 1,000 mg BID.  She is walking for exercise.  She specifically denies chest pain, unexplained abdominal pain, nausea or vomiting, and documented hypoglycemia. She does check blood sugars 3 times per week where her sugars range in the 100-130's.   She came in fasting today in anticipation of lab work. Her last HgbA1c was done 4 months ago and was 6.5%.  She denies any long term complications of diabetes including no diabetic retinopathy, neuropathy, nephropathy or cardiovascular disease.  Her last diabetic dilated eye exam was done on 05/17/2022 and this showed no evidence of retinopathy.   The patient is a 77 year old Caucasian/White female who presents for a follow-up evaluation of hypertension. She was diagnosed with HTN and HCL around 10 years ago.  On her last visit, her BP was elevated and we were bringing her back for followup.  The patient has been checking her blood pressure at home. The patient's blood pressure has ranged from 140's.  The patient's current medications include: lisinopril 20 mg daily. The patient has been tolerating her medications well. The patient denies any headache, visual changes, dizziness, lightheadness, chest pain, shortness of breath, weakness/numbness, and edema. She reports there have been no other symptoms noted.    Kim Shepherd returns today for routine followup on her cholesterol. Overall, she states she is doing well and is without any complaints or problems at this  time. She specifically denies chest pain, fatigue, abdominal pain, nausea, diarrhea, and myalgias. She remains on dietary management as well as the following cholesterol lowering medications rosuvastatin 20 mg qhs. She is fasting in anticipation for labs today.      Past Medical History Past Medical History:  Diagnosis Date   Diabetes mellitus, type 2 (HCC)    Dyslipidemia    Essential hypertension    Hypothyroidism      Allergies Allergies  Allergen Reactions   Penicillins Rash   Sulfa Antibiotics Rash     Medications  Current Outpatient Medications:    metFORMIN (GLUCOPHAGE) 1000 MG tablet, Take 1,000 mg by mouth 2 (two) times daily with a meal., Disp: , Rfl:    Calcium Citrate-Vitamin D 315-5 MG-MCG TABS, Take 1 tablet by mouth 2 (two) times daily., Disp: , Rfl:    glimepiride (AMARYL) 2 MG tablet, Take 2 mg by mouth daily., Disp: , Rfl:    lisinopril (ZESTRIL) 20 MG tablet, Take 1.5 tablets (30 mg total) by mouth daily., Disp: 135 tablet, Rfl: 3   rosuvastatin (CRESTOR) 20 MG tablet, Take 20 mg by mouth at bedtime., Disp: , Rfl:    SYNTHROID 100 MCG tablet, Take 100 mcg by mouth every morning., Disp: , Rfl:    Review of Systems Review of Systems  Constitutional:  Negative for chills, fever and malaise/fatigue.  Eyes:  Negative for blurred vision and double vision.  Respiratory:  Negative for cough, shortness of breath and wheezing.  Cardiovascular:  Negative for chest pain, palpitations and leg swelling.  Gastrointestinal:  Negative for abdominal pain, constipation, diarrhea, nausea and vomiting.  Musculoskeletal:  Negative for myalgias.  Skin:  Negative for itching and rash.  Neurological:  Negative for dizziness, weakness and headaches.  Psychiatric/Behavioral:  Negative for depression. The patient is not nervous/anxious.        Objective:    Vitals BP (!) 158/90   Pulse 72   Temp 98 F (36.7 C)   Resp 16   Ht '5\' 5"'$  (1.651 m)   Wt 148 lb 6.4 oz (67.3 kg)    SpO2 98%   BMI 24.70 kg/m    Physical Examination Physical Exam Constitutional:      Appearance: Normal appearance. She is not ill-appearing.  Neck:     Vascular: No carotid bruit.  Cardiovascular:     Rate and Rhythm: Normal rate and regular rhythm.     Pulses: Normal pulses.     Heart sounds: No murmur heard.    No friction rub. No gallop.  Pulmonary:     Effort: Pulmonary effort is normal. No respiratory distress.     Breath sounds: No wheezing, rhonchi or rales.  Abdominal:     General: Bowel sounds are normal. There is no distension.     Palpations: Abdomen is soft.     Tenderness: There is no abdominal tenderness.  Musculoskeletal:     Cervical back: Neck supple.     Right lower leg: No edema.     Left lower leg: No edema.  Skin:    General: Skin is warm and dry.     Findings: No rash.  Neurological:     General: No focal deficit present.     Mental Status: She is alert and oriented to person, place, and time.  Psychiatric:        Mood and Affect: Mood normal.        Behavior: Behavior normal.        Assessment & Plan:   Essential hypertension Her BP is elevated again.  She is checking her BP at home and it is elevated.  I am going to increase her lisinopril from '20mg'$  to '30mg'$  daily.  Diabetes mellitus without complication ([place]) We will check a HgBA1c on her today with goal <7%.  Hypercholesterolemia We will also check her FLP with goal LDL <100.    Return in about 4 months (around 12/31/2022) for annual.   Townsend Roger, MD

## 2022-09-07 NOTE — Assessment & Plan Note (Signed)
Her BP is elevated again.  She is checking her BP at home and it is elevated.  I am going to increase her lisinopril from '20mg'$  to '30mg'$  daily.

## 2022-09-08 LAB — LIPID PANEL
Chol/HDL Ratio: 2.4 ratio (ref 0.0–4.4)
Cholesterol, Total: 111 mg/dL (ref 100–199)
HDL: 47 mg/dL (ref >39)
LDL Chol Calc (NIH): 34 mg/dL (ref 0–99)
Triglycerides: 186 mg/dL — ABNORMAL HIGH (ref 0–149)
VLDL Cholesterol Cal: 30 mg/dL (ref 5–40)

## 2022-09-08 LAB — HEMOGLOBIN A1C
Est. average glucose Bld gHb Est-mCnc: 148 mg/dL
Hgb A1c MFr Bld: 6.8 % — ABNORMAL HIGH (ref 4.8–5.6)

## 2022-09-10 ENCOUNTER — Other Ambulatory Visit: Payer: Self-pay | Admitting: Internal Medicine

## 2022-09-10 DIAGNOSIS — Z1231 Encounter for screening mammogram for malignant neoplasm of breast: Secondary | ICD-10-CM

## 2022-09-17 ENCOUNTER — Telehealth: Payer: Self-pay

## 2022-09-17 NOTE — Telephone Encounter (Signed)
-----   Message from Townsend Roger, MD sent at 09/16/2022  6:55 PM EDT ----- Her diabetes is controlled.  Her TG level is elevated- cut fats in diet and exercise.  Her LDL is too controlled.  I want her to start taking her rosuvastatin every other night.

## 2022-09-17 NOTE — Telephone Encounter (Signed)
Pt notified and will start taking rosuvastatin every other night.

## 2022-10-24 ENCOUNTER — Ambulatory Visit
Admission: RE | Admit: 2022-10-24 | Discharge: 2022-10-24 | Disposition: A | Discharge status: Home / Self Care | Payer: Medicare Other | Source: Ambulatory Visit | Attending: Internal Medicine | Admitting: Internal Medicine

## 2022-10-24 DIAGNOSIS — Z1231 Encounter for screening mammogram for malignant neoplasm of breast: Secondary | ICD-10-CM

## 2022-12-05 ENCOUNTER — Encounter: Payer: Self-pay | Admitting: Internal Medicine

## 2022-12-05 ENCOUNTER — Ambulatory Visit: Payer: Medicare Other | Admitting: Internal Medicine

## 2022-12-05 VITALS — BP 142/80 | HR 76 | Temp 98.0°F | Resp 16 | Ht 65.0 in | Wt 148.0 lb

## 2022-12-05 DIAGNOSIS — Z0000 Encounter for general adult medical examination without abnormal findings: Secondary | ICD-10-CM

## 2022-12-05 DIAGNOSIS — E7800 Pure hypercholesterolemia, unspecified: Secondary | ICD-10-CM

## 2022-12-05 DIAGNOSIS — I10 Essential (primary) hypertension: Secondary | ICD-10-CM

## 2022-12-05 DIAGNOSIS — E119 Type 2 diabetes mellitus without complications: Secondary | ICD-10-CM

## 2022-12-05 DIAGNOSIS — M8580 Other specified disorders of bone density and structure, unspecified site: Secondary | ICD-10-CM | POA: Diagnosis not present

## 2022-12-05 DIAGNOSIS — E039 Hypothyroidism, unspecified: Secondary | ICD-10-CM | POA: Diagnosis not present

## 2022-12-05 DIAGNOSIS — Z6824 Body mass index (BMI) 24.0-24.9, adult: Secondary | ICD-10-CM | POA: Diagnosis not present

## 2022-12-05 MED ORDER — LEVOTHYROXINE SODIUM 100 MCG PO TABS: 100.0000 ug | ORAL_TABLET | Freq: Every day | ORAL | 3 refills | Status: DC

## 2022-12-05 MED ORDER — LISINOPRIL 40 MG PO TABS: 40.0000 mg | ORAL_TABLET | Freq: Every day | ORAL | 3 refills | Status: AC

## 2022-12-05 NOTE — Assessment & Plan Note (Signed)
She seems euthyroid.  We will check her TFT's today. 

## 2022-12-05 NOTE — Assessment & Plan Note (Signed)
I want her to eat healthy and exercise. 

## 2022-12-05 NOTE — Assessment & Plan Note (Signed)
Her BP is still elevated.  We will increase her lisinopril from 30mg  to 40mg  daily.  She needs to avoid salt.

## 2022-12-05 NOTE — Assessment & Plan Note (Signed)
Continue with walking and her calcium/vit D.  We will check her vit d level today.

## 2022-12-05 NOTE — Assessment & Plan Note (Signed)
Her diabetes has been controlled.  Her diabetic foot exam was normal.  We will order diabetic labs and urine study.

## 2022-12-05 NOTE — Progress Notes (Signed)
Preventive Screening-Counseling & Management     Kim Shepherd is a 77 y.o. female who presents for Medicare Annual/Subsequent preventive examination.  Kim Shepherd is a 77 year old Caucasian/White female who presents for her annual wellness exam. She is due for the following health maintenance studies: visual exam, screening labs, and diabetic foot exam. This patient's past medical history Diabetes Mellitus, Type II, Dyslipidemia, Essential Hypertension, and Hypothyroidism.   Her last diabetic dilated eye exam was done on 05/17/2022 and this showed no evidence of retinopathy.  She has had bilateral cataract surgery in the past.  Her last digital screening mammogram was done on 10/24/2022 and this was normal.  Her last colonoscopy was done on 08/14/2013 and this showed one polyp and some external hemorrhoids.  Her pathology showed benign lymphoid tissue.  She states he is not recommending another colonoscopy.  She had a prevnar 13 vaccine in 02/09/2014, she had a pneumovax 23 vaccine in 11/09/2016 and a repeat pneumovax 23 in 2023.  She had a zostavax vaccine in 12/10/2010.  She is not interested in the shingrix vaccine.  She is not interested in the RSV vaccine.  She has had 3 COVID-19 vaccines including 1 booster.  The patient denies any depression, anxiety or memory loss.  She is not on an ASA.   The patient is a 77 year old Caucasian/White female who returns for a follow-up visit for her T2 diabetes.   She believes she was diagnosed with T2 diabetes maybe 10 years ago.  Since the last visit, there have been no problems. She remains on glimepiride 2 mg daily and metformin 1,000 mg BID.  She is walking for exercise.  She specifically denies chest pain, unexplained abdominal pain, nausea or vomiting, and documented hypoglycemia. She does check blood sugars 3 times per week where her sugars range in the 100-130's.   She came in fasting today in anticipation of lab work. Her last HgbA1c was done 3 months  ago and was 6.8%.  She denies any long term complications of diabetes including no diabetic retinopathy, neuropathy, nephropathy or cardiovascular disease.  Her last diabetic dilated eye exam was done on 05/17/2022 and this showed no evidence of retinopathy.   The patient is a 77 year old Caucasian/White female who presents for a follow-up evaluation of hypertension. She was diagnosed with HTN and HCL around 10 years ago.  On her last visit, her BP was elevated and we increased her lisinopril from 20mg  to 30mg  daily.  The patient has been checking her blood pressure at home. The patient's blood pressure has ranged from 130-140's.  The patient's current medications include: lisinopril 30 mg daily. The patient has been tolerating her medications well. The patient denies any headache, visual changes, dizziness, lightheadness, chest pain, shortness of breath, weakness/numbness, and edema. She reports there have been no other symptoms noted.    Kim Shepherd returns today for routine followup on her cholesterol. On her last visit, her cholesterol was too controlled and I asked her to take her crestor every other day.  Overall, she states she is doing well and is without any complaints or problems at this time. She specifically denies chest pain, fatigue, abdominal pain, nausea, diarrhea, and myalgias. She remains on dietary management as well as the following cholesterol lowering medications rosuvastatin 20 mg every other day. She is fasting in anticipation for labs today.   The patient is a 77 year old female, post-menopausal, who presents for followup of her  osteoporosis. She has no specific complaints related to bone loss.  She denies a family history of osteoporosis. The following risk factors are noted: diabetes.   She denies the following: high caffeine intake, alcohol consumption of more that 7 ounces per week, daily prednisone use, hyperthyroidism, surgical resection of her bowel, and surgical resection of  her stomach. She states she exercises routinely. A bone mineral density study was performed on 06/07/2021 which showed a femur t-score of -1.4.  She was asked to do weight bearing exercises at that time and start calcium and Vit D.      Are there smokers in your home (other than you)? No  Risk Factors Current exercise habits:  as above   Dietary issues discussed: none   Depression Screen (Note: if answer to either of the following is "Yes", a more complete depression screening is indicated)   Over the past two weeks, have you felt down, depressed or hopeless? No  Over the past two weeks, have you felt little interest or pleasure in doing things? No  Have you lost interest or pleasure in daily life? No  Do you often feel hopeless? No  Do you cry easily over simple problems? No  Activities of Daily Living In your present state of health, do you have any difficulty performing the following activities?:  Driving? No Managing money?  No Feeding yourself? No Getting from bed to chair? No Climbing a flight of stairs? No Preparing food and eating?: No Bathing or showering? No Getting dressed: No Getting to the toilet? No Using the toilet:No Moving around from place to place: No In the past year have you fallen or had a near fall?:No   Are you sexually active?  No  Do you have more than one partner?  No  Hearing Difficulties: No Do you often ask people to speak up or repeat themselves? No Do you experience ringing or noises in your ears? No Do you have difficulty understanding soft or whispered voices? No   Do you feel that you have a problem with memory? No  Do you often misplace items? No  Do you feel safe at home?  Yes  Cognitive Testing  Alert? Yes  Normal Appearance?Yes  Oriented to person? Yes  Place? Yes   Time? Yes  Recall of three objects?  Yes  Can perform simple calculations? Yes  Displays appropriate judgment?Yes  Can read the correct time from a watch  face?Yes  Fall Risk Prevention  Any stairs in or around the home? No  If so, are there any without handrails? No  Home free of loose throw rugs in walkways, pet beds, electrical cords, etc? Yes  Adequate lighting in your home to reduce risk of falls? Yes  Use of a cane, walker or w/c? No    Time Up and Go  Was the test performed? Yes .  Length of time to ambulate 10 feet: 10 sec.   Gait slow and steady without use of assistive device    Advanced Directives have been discussed with the patient? Yes   List the Names of Other Physician/Practitioners you currently use: Patient Care Team: Crist Fat, MD as PCP - General (Internal Medicine)    Past Medical History:  Diagnosis Date   Diabetes mellitus, type 2 (HCC)    Dyslipidemia    Essential hypertension    Hypothyroidism    Osteopenia     Past Surgical History:  Procedure Laterality Date   TUBAL LIGATION  Current Medications  Current Outpatient Medications  Medication Sig Dispense Refill   Calcium Citrate-Vitamin D 315-5 MG-MCG TABS Take 1 tablet by mouth 2 (two) times daily.     glimepiride (AMARYL) 2 MG tablet Take 2 mg by mouth daily.     metFORMIN (GLUCOPHAGE) 1000 MG tablet Take 1,000 mg by mouth 2 (two) times daily with a meal.     rosuvastatin (CRESTOR) 20 MG tablet Take 20 mg by mouth at bedtime.     SYNTHROID 100 MCG tablet Take 100 mcg by mouth every morning.     No current facility-administered medications for this visit.    Allergies Penicillins and Sulfa antibiotics   Social History Social History   Tobacco Use   Smoking status: Never   Smokeless tobacco: Never  Substance Use Topics   Alcohol use: Never     Review of Systems Review of Systems  Constitutional:  Negative for chills, fever, malaise/fatigue and weight loss.  HENT:  Negative for hearing loss and tinnitus.   Eyes:  Negative for blurred vision and double vision.  Respiratory:  Negative for cough, hemoptysis, shortness  of breath and wheezing.   Cardiovascular:  Negative for chest pain, palpitations and leg swelling.  Gastrointestinal:  Negative for abdominal pain, blood in stool, constipation, diarrhea, heartburn, melena, nausea and vomiting.  Genitourinary:  Negative for frequency and hematuria.  Musculoskeletal:  Negative for back pain and myalgias.  Skin:  Negative for itching and rash.  Neurological:  Negative for dizziness, weakness and headaches.  Psychiatric/Behavioral:  Negative for depression and memory loss. The patient is not nervous/anxious and does not have insomnia.      Physical Exam:      Body mass index is 24.63 kg/m. BP (!) 142/80   Pulse 76   Temp 98 F (36.7 C)   Resp 16   Ht 5\' 5"  (1.651 m)   Wt 148 lb (67.1 kg)   SpO2 99%   BMI 24.63 kg/m   Physical Exam Constitutional:      Appearance: Normal appearance. She is not ill-appearing.  HENT:     Head: Normocephalic and atraumatic.     Right Ear: Tympanic membrane, ear canal and external ear normal.     Left Ear: Tympanic membrane, ear canal and external ear normal.     Nose: Nose normal. No congestion or rhinorrhea.     Mouth/Throat:     Mouth: Mucous membranes are moist.     Pharynx: Oropharynx is clear. No posterior oropharyngeal erythema.  Eyes:     General: No scleral icterus.    Conjunctiva/sclera: Conjunctivae normal.     Pupils: Pupils are equal, round, and reactive to light.  Neck:     Thyroid: No thyromegaly.     Vascular: No carotid bruit.  Cardiovascular:     Rate and Rhythm: Normal rate and regular rhythm.     Pulses: Normal pulses.     Heart sounds: Normal heart sounds. No murmur heard.    No friction rub. No gallop.  Pulmonary:     Effort: Pulmonary effort is normal. No respiratory distress.     Breath sounds: Normal breath sounds. No wheezing, rhonchi or rales.  Abdominal:     General: Abdomen is flat. Bowel sounds are normal. There is no distension.     Palpations: Abdomen is soft.      Tenderness: There is no abdominal tenderness.  Musculoskeletal:     Cervical back: Normal range of motion. No tenderness.  Right lower leg: No edema.     Left lower leg: No edema.     Comments: No clubbing or cyanosis  Lymphadenopathy:     Cervical: No cervical adenopathy.  Skin:    General: Skin is warm and dry.     Findings: No rash.  Neurological:     General: No focal deficit present.     Mental Status: She is alert and oriented to person, place, and time.     Comments: CN II-XII grossly intact  Psychiatric:        Mood and Affect: Mood normal.        Behavior: Behavior normal.      Assessment:      T2 Diabetes mellitus without complication (HCC)  Essential hypertension  Hypercholesterolemia  Hypothyroidism, unspecified type  Osteopenia, unspecified location  BMI 24.0-24.9, adult    Plan:     During the course of the visit the patient was educated and counseled about appropriate screening and preventive services including:   Pneumococcal vaccine  Influenza vaccine Screening mammography Screening Pap smear and pelvic exam  Bone densitometry screening Diabetes screening  Diet review for nutrition referral? Yes ____  Not Indicated __X__   Patient Instructions (the written plan) was given to the patient.  Essential hypertension Her BP is still elevated.  We will increase her lisinopril from 30mg  to 40mg  daily.  She needs to avoid salt.  Diabetes mellitus without complication (HCC) Her diabetes has been controlled.  Her diabetic foot exam was normal.  We will order diabetic labs and urine study.  Hypothyroidism She seems euthyroid.  We will check her TFT's today.  Osteopenia Continue with walking and her calcium/vit D.  We will check her vit d level today.  BMI 24.0-24.9, adult I want her to eat healthy and exercise.  Hypercholesterolemia We will recheck her FLP since we adjusted her timing of her medication.  Her goal LDL <100.     Prevention   Medicare Attestation I have personally reviewed: The patient's medical and social history Their use of alcohol, tobacco or illicit drugs Their current medications and supplements The patient's functional ability including ADLs,fall risks, home safety risks, cognitive, and hearing and visual impairment Diet and physical activities Evidence for depression or mood disorders  The patient's weight, height, and BMI have been recorded in the chart.  I have made referrals, counseling, and provided education to the patient based on review of the above and I have provided the patient with a written personalized care plan for preventive services.     Crist Fat, MD   12/05/2022

## 2022-12-05 NOTE — Assessment & Plan Note (Signed)
We will recheck her FLP since we adjusted her timing of her medication.  Her goal LDL <100.

## 2022-12-06 LAB — CBC WITH DIFFERENTIAL/PLATELET
Basophils Absolute: 0.1 10*3/uL (ref 0.0–0.2)
Basos: 1 %
EOS (ABSOLUTE): 0.1 10*3/uL (ref 0.0–0.4)
Eos: 1 %
Hematocrit: 41.5 % (ref 34.0–46.6)
Hemoglobin: 13.6 g/dL (ref 11.1–15.9)
Immature Grans (Abs): 0 10*3/uL (ref 0.0–0.1)
Immature Granulocytes: 0 %
Lymphocytes Absolute: 2.9 10*3/uL (ref 0.7–3.1)
Lymphs: 31 %
MCH: 30.6 pg (ref 26.6–33.0)
MCHC: 32.8 g/dL (ref 31.5–35.7)
MCV: 93 fL (ref 79–97)
Monocytes Absolute: 0.8 10*3/uL (ref 0.1–0.9)
Monocytes: 9 %
Neutrophils Absolute: 5.4 10*3/uL (ref 1.4–7.0)
Neutrophils: 58 %
Platelets: 399 10*3/uL (ref 150–450)
RBC: 4.45 x10E6/uL (ref 3.77–5.28)
RDW: 12.8 % (ref 11.7–15.4)
WBC: 9.3 10*3/uL (ref 3.4–10.8)

## 2022-12-06 LAB — MICROALBUMIN / CREATININE URINE RATIO
Creatinine, Urine: 23.9 mg/dL
Microalb/Creat Ratio: <13 mg/g creat (ref 0–29)
Microalbumin, Urine: <3 ug/mL

## 2022-12-06 LAB — T4, FREE: Free T4: 1.57 ng/dL (ref 0.82–1.77)

## 2022-12-06 LAB — CMP14 + ANION GAP
ALT: 18 IU/L (ref 0–32)
AST: 19 IU/L (ref 0–40)
Albumin/Globulin Ratio: 1.7 (ref 1.2–2.2)
Albumin: 4.7 g/dL (ref 3.8–4.8)
Alkaline Phosphatase: 60 IU/L (ref 44–121)
Anion Gap: 16 mmol/L (ref 10.0–18.0)
BUN/Creatinine Ratio: 14 (ref 12–28)
BUN: 11 mg/dL (ref 8–27)
Bilirubin Total: 0.3 mg/dL (ref 0.0–1.2)
CO2: 22 mmol/L (ref 20–29)
Calcium: 10.4 mg/dL — ABNORMAL HIGH (ref 8.7–10.3)
Chloride: 100 mmol/L (ref 96–106)
Creatinine, Ser: 0.81 mg/dL (ref 0.57–1.00)
Globulin, Total: 2.8 g/dL (ref 1.5–4.5)
Glucose: 70 mg/dL (ref 70–99)
Potassium: 4.8 mmol/L (ref 3.5–5.2)
Sodium: 138 mmol/L (ref 134–144)
Total Protein: 7.5 g/dL (ref 6.0–8.5)
eGFR: 76 mL/min/{1.73_m2} (ref >59)

## 2022-12-06 LAB — LIPID PANEL
Chol/HDL Ratio: 2.2 ratio (ref 0.0–4.4)
Cholesterol, Total: 114 mg/dL (ref 100–199)
HDL: 51 mg/dL (ref >39)
LDL Chol Calc (NIH): 40 mg/dL (ref 0–99)
Triglycerides: 130 mg/dL (ref 0–149)
VLDL Cholesterol Cal: 23 mg/dL (ref 5–40)

## 2022-12-06 LAB — HEMOGLOBIN A1C
Est. average glucose Bld gHb Est-mCnc: 151 mg/dL
Hgb A1c MFr Bld: 6.9 % — ABNORMAL HIGH (ref 4.8–5.6)

## 2022-12-06 LAB — VITAMIN D 25 HYDROXY (VIT D DEFICIENCY, FRACTURES): Vit D, 25-Hydroxy: 48.6 ng/mL (ref 30.0–100.0)

## 2022-12-06 LAB — TSH: TSH: 0.889 u[IU]/mL (ref 0.450–4.500)

## 2022-12-10 ENCOUNTER — Other Ambulatory Visit: Payer: Self-pay

## 2022-12-10 MED ORDER — LEVOTHYROXINE SODIUM 100 MCG PO TABS: 100.0000 ug | ORAL_TABLET | Freq: Every day | ORAL | 1 refills | Status: AC

## 2022-12-12 ENCOUNTER — Telehealth: Payer: Self-pay

## 2022-12-12 NOTE — Telephone Encounter (Signed)
Pt notified of lab results. Pt states that lisinopril 40mg  is causing headaches. Pt would like to go back to 30mg  if possible. Will notify MD and call pt with response.

## 2022-12-12 NOTE — Telephone Encounter (Signed)
-----   Message from Crist Fat, MD sent at 12/11/2022  9:22 AM EDT ----- Her labs look good.  Her calcium level is a bit elevated but we will repeat this at her next visit.

## 2022-12-13 ENCOUNTER — Telehealth: Payer: Self-pay

## 2022-12-13 NOTE — Telephone Encounter (Signed)
Follow up for pt stating that lisinopril 40 mg was causing headaches and pt would like to go back to 30mg . Per Dr. Leonia Reader that is fine and we will check at next visit.

## 2023-02-12 ENCOUNTER — Other Ambulatory Visit: Payer: Self-pay

## 2023-02-12 MED ORDER — METFORMIN HCL 1000 MG PO TABS: 1000.0000 mg | ORAL_TABLET | Freq: Two times a day (BID) | ORAL | 0 refills | Status: DC

## 2023-02-12 MED ORDER — METFORMIN HCL 1000 MG PO TABS: 1000.0000 mg | ORAL_TABLET | Freq: Two times a day (BID) | ORAL | 1 refills | Status: AC

## 2023-02-12 NOTE — Progress Notes (Signed)
Med refill entered an error.

## 2023-03-11 ENCOUNTER — Encounter: Payer: Self-pay | Admitting: Internal Medicine

## 2023-03-11 ENCOUNTER — Ambulatory Visit: Payer: Medicare Other | Admitting: Internal Medicine

## 2023-03-11 VITALS — BP 136/78 | HR 85 | Temp 98.0°F | Resp 18 | Ht 65.0 in | Wt 148.2 lb

## 2023-03-11 DIAGNOSIS — I10 Essential (primary) hypertension: Secondary | ICD-10-CM

## 2023-03-11 DIAGNOSIS — E119 Type 2 diabetes mellitus without complications: Secondary | ICD-10-CM | POA: Diagnosis not present

## 2023-03-11 MED ORDER — ROSUVASTATIN CALCIUM 20 MG PO TABS: 20.0000 mg | ORAL_TABLET | Freq: Every day | ORAL | 3 refills | Status: AC

## 2023-03-11 NOTE — Assessment & Plan Note (Signed)
Her BP looks good today.  We will continue to monitor.

## 2023-03-11 NOTE — Assessment & Plan Note (Signed)
We will check a HgBA1c on her today.  I want her to continue to exercise and eat healthy and continue her current meds.

## 2023-03-11 NOTE — Progress Notes (Signed)
Office Visit  Subjective   Patient ID: Kim Shepherd   DOB: 05-11-1946   Age: 77 y.o.   MRN: [id number]   Chief Complaint Chief Complaint  Patient presents with   Follow-up     History of Present Illness The patient is a 77 year old Caucasian/White female who returns for a follow-up visit for her T2 diabetes.   She believes she was diagnosed with T2 diabetes maybe 10 years ago.  Since the last visit, there have been no problems. She remains on glimepiride 2 mg daily and metformin 1,000 mg BID.  She is walking for exercise.  She specifically denies chest pain, unexplained abdominal pain, nausea or vomiting, and documented hypoglycemia. She does check blood sugars 3 times per week where her sugars range in the 100-120.   She came in fasting today in anticipation of lab work. Her last HgbA1c was done 3 months ago and was 6.9%.  She denies any long term complications of diabetes including no diabetic retinopathy, neuropathy, nephropathy or cardiovascular disease.  Her last diabetic dilated eye exam was done on 05/17/2022 and this showed no evidence of retinopathy.   The patient is a 77 year old Caucasian/White female who presents for a follow-up evaluation of hypertension. She was diagnosed with HTN and HCL around 10 years ago.  On her last visit, her BP was elevated and we increased her lisinopril from 30mg  to 40mg  daily.  The patient has been checking her blood pressure at home. The patient's blood pressure has ranged from 130-140's.  The patient's current medications include: lisinopril 30 mg daily. The patient has been tolerating her medications well. The patient denies any headache, visual changes, dizziness, lightheadness, chest pain, shortness of breath, weakness/numbness, and edema. She reports there have been no other symptoms noted.        Past Medical History Past Medical History:  Diagnosis Date   Diabetes mellitus, type 2 (HCC)    Dyslipidemia    Essential hypertension     Hypothyroidism    Osteopenia      Allergies Allergies  Allergen Reactions   Penicillins Rash   Sulfa Antibiotics Rash     Medications  Current Outpatient Medications:    Calcium Citrate-Vitamin D 315-5 MG-MCG TABS, Take 1 tablet by mouth 2 (two) times daily., Disp: , Rfl:    glimepiride (AMARYL) 2 MG tablet, Take 2 mg by mouth daily., Disp: , Rfl:    levothyroxine (SYNTHROID) 100 MCG tablet, Take 1 tablet (100 mcg total) by mouth daily before breakfast., Disp: 90 tablet, Rfl: 1   lisinopril (ZESTRIL) 40 MG tablet, Take 1 tablet (40 mg total) by mouth daily., Disp: 90 tablet, Rfl: 3   metFORMIN (GLUCOPHAGE) 1000 MG tablet, Take 1 tablet (1,000 mg total) by mouth 2 (two) times daily with a meal., Disp: 180 tablet, Rfl: 1   rosuvastatin (CRESTOR) 20 MG tablet, Take 20 mg by mouth at bedtime., Disp: , Rfl:    Review of Systems Review of Systems  Constitutional:  Negative for chills and fever.  Eyes:  Negative for blurred vision and double vision.  Respiratory:  Negative for cough and shortness of breath.   Cardiovascular:  Negative for chest pain, palpitations and leg swelling.  Gastrointestinal:  Negative for abdominal pain, constipation, diarrhea, nausea and vomiting.  Genitourinary:  Negative for frequency.  Neurological:  Negative for dizziness, weakness and headaches.  Endo/Heme/Allergies:  Negative for polydipsia.       Objective:    Vitals BP 136/78  Pulse 85   Temp 98 F (36.7 C)   Resp 18   Ht 5\' 5"  (1.651 m)   Wt 148 lb 3.2 oz (67.2 kg)   SpO2 98%   BMI 24.66 kg/m    Physical Examination Physical Exam Constitutional:      Appearance: Normal appearance. She is not ill-appearing.  Cardiovascular:     Rate and Rhythm: Normal rate and regular rhythm.     Pulses: Normal pulses.     Heart sounds: No murmur heard.    No friction rub. No gallop.  Pulmonary:     Effort: Pulmonary effort is normal. No respiratory distress.     Breath sounds: No wheezing,  rhonchi or rales.  Abdominal:     General: Bowel sounds are normal. There is no distension.     Palpations: Abdomen is soft.     Tenderness: There is no abdominal tenderness.  Musculoskeletal:     Right lower leg: No edema.     Left lower leg: No edema.  Skin:    General: Skin is warm and dry.     Findings: No rash.  Neurological:     Mental Status: She is alert.        Assessment & Plan:   Essential hypertension Her BP looks good today.  We will continue to monitor.  Diabetes mellitus without complication (HCC) We will check a HgBA1c on her today.  I want her to continue to exercise and eat healthy and continue her current meds.    Return in about 3 months (around 06/10/2023).   Crist Fat, MD

## 2023-03-12 LAB — HEMOGLOBIN A1C
Est. average glucose Bld gHb Est-mCnc: 154 mg/dL
Hgb A1c MFr Bld: 7 % — ABNORMAL HIGH (ref 4.8–5.6)

## 2023-03-14 ENCOUNTER — Other Ambulatory Visit: Payer: Self-pay

## 2023-03-14 DIAGNOSIS — E119 Type 2 diabetes mellitus without complications: Secondary | ICD-10-CM

## 2023-03-14 MED ORDER — DAPAGLIFLOZIN PROPANEDIOL 10 MG PO TABS: 10.0000 mg | ORAL_TABLET | Freq: Every day | ORAL | 1 refills | Status: AC

## 2023-03-14 NOTE — Progress Notes (Signed)
Her diabetes control is borderline. Add farxiga 10mg  daily to her regimen.   Called patient made aware of lab results and new medication

## 2023-03-14 NOTE — Progress Notes (Signed)
New Rx

## 2023-05-14 DIAGNOSIS — Z1321 Encounter for screening for nutritional disorder: Secondary | ICD-10-CM | POA: Diagnosis not present

## 2023-05-14 DIAGNOSIS — E785 Hyperlipidemia, unspecified: Secondary | ICD-10-CM | POA: Diagnosis not present

## 2023-05-14 DIAGNOSIS — Z6824 Body mass index (BMI) 24.0-24.9, adult: Secondary | ICD-10-CM | POA: Diagnosis not present

## 2023-05-14 DIAGNOSIS — I10 Essential (primary) hypertension: Secondary | ICD-10-CM | POA: Diagnosis not present

## 2023-05-14 DIAGNOSIS — E039 Hypothyroidism, unspecified: Secondary | ICD-10-CM | POA: Diagnosis not present

## 2023-05-14 DIAGNOSIS — E119 Type 2 diabetes mellitus without complications: Secondary | ICD-10-CM | POA: Diagnosis not present

## 2023-05-20 DIAGNOSIS — E1165 Type 2 diabetes mellitus with hyperglycemia: Secondary | ICD-10-CM | POA: Diagnosis not present

## 2023-05-20 DIAGNOSIS — I10 Essential (primary) hypertension: Secondary | ICD-10-CM | POA: Diagnosis not present

## 2023-06-12 ENCOUNTER — Ambulatory Visit: Payer: Medicare Other | Admitting: Internal Medicine

## 2023-06-28 DIAGNOSIS — E1165 Type 2 diabetes mellitus with hyperglycemia: Secondary | ICD-10-CM | POA: Diagnosis not present

## 2023-06-28 DIAGNOSIS — I10 Essential (primary) hypertension: Secondary | ICD-10-CM | POA: Diagnosis not present

## 2023-06-28 DIAGNOSIS — E039 Hypothyroidism, unspecified: Secondary | ICD-10-CM | POA: Diagnosis not present

## 2023-08-16 DIAGNOSIS — E559 Vitamin D deficiency, unspecified: Secondary | ICD-10-CM | POA: Diagnosis not present

## 2023-08-16 DIAGNOSIS — E785 Hyperlipidemia, unspecified: Secondary | ICD-10-CM | POA: Diagnosis not present

## 2023-08-16 DIAGNOSIS — Z1321 Encounter for screening for nutritional disorder: Secondary | ICD-10-CM | POA: Diagnosis not present

## 2023-08-16 DIAGNOSIS — I10 Essential (primary) hypertension: Secondary | ICD-10-CM | POA: Diagnosis not present

## 2023-08-16 DIAGNOSIS — E039 Hypothyroidism, unspecified: Secondary | ICD-10-CM | POA: Diagnosis not present

## 2023-08-16 DIAGNOSIS — E119 Type 2 diabetes mellitus without complications: Secondary | ICD-10-CM | POA: Diagnosis not present

## 2023-10-07 ENCOUNTER — Other Ambulatory Visit: Payer: Self-pay | Admitting: Nurse Practitioner

## 2023-10-07 DIAGNOSIS — Z0000 Encounter for general adult medical examination without abnormal findings: Secondary | ICD-10-CM

## 2023-10-25 ENCOUNTER — Ambulatory Visit
Admission: RE | Admit: 2023-10-25 | Discharge: 2023-10-25 | Disposition: A | Discharge status: Home / Self Care | Source: Ambulatory Visit | Attending: Nurse Practitioner | Admitting: Nurse Practitioner

## 2023-10-25 DIAGNOSIS — Z1231 Encounter for screening mammogram for malignant neoplasm of breast: Secondary | ICD-10-CM | POA: Diagnosis not present

## 2023-10-25 DIAGNOSIS — Z0000 Encounter for general adult medical examination without abnormal findings: Secondary | ICD-10-CM

## 2023-11-13 DIAGNOSIS — E1165 Type 2 diabetes mellitus with hyperglycemia: Secondary | ICD-10-CM | POA: Diagnosis not present

## 2023-11-13 DIAGNOSIS — E039 Hypothyroidism, unspecified: Secondary | ICD-10-CM | POA: Diagnosis not present

## 2023-11-13 DIAGNOSIS — I8390 Asymptomatic varicose veins of unspecified lower extremity: Secondary | ICD-10-CM | POA: Diagnosis not present

## 2023-11-13 DIAGNOSIS — Z7289 Other problems related to lifestyle: Secondary | ICD-10-CM | POA: Diagnosis not present

## 2023-11-13 DIAGNOSIS — Z0001 Encounter for general adult medical examination with abnormal findings: Secondary | ICD-10-CM | POA: Diagnosis not present

## 2023-11-13 DIAGNOSIS — E559 Vitamin D deficiency, unspecified: Secondary | ICD-10-CM | POA: Diagnosis not present

## 2023-11-13 DIAGNOSIS — Z79891 Long term (current) use of opiate analgesic: Secondary | ICD-10-CM | POA: Diagnosis not present

## 2023-11-13 DIAGNOSIS — I10 Essential (primary) hypertension: Secondary | ICD-10-CM | POA: Diagnosis not present

## 2023-11-13 DIAGNOSIS — E785 Hyperlipidemia, unspecified: Secondary | ICD-10-CM | POA: Diagnosis not present

## 2023-11-13 DIAGNOSIS — Z1321 Encounter for screening for nutritional disorder: Secondary | ICD-10-CM | POA: Diagnosis not present

## 2023-11-13 DIAGNOSIS — Z136 Encounter for screening for cardiovascular disorders: Secondary | ICD-10-CM | POA: Diagnosis not present

## 2024-02-17 DIAGNOSIS — E785 Hyperlipidemia, unspecified: Secondary | ICD-10-CM | POA: Diagnosis not present

## 2024-02-17 DIAGNOSIS — E559 Vitamin D deficiency, unspecified: Secondary | ICD-10-CM | POA: Diagnosis not present

## 2024-02-17 DIAGNOSIS — E1165 Type 2 diabetes mellitus with hyperglycemia: Secondary | ICD-10-CM | POA: Diagnosis not present

## 2024-02-17 DIAGNOSIS — E119 Type 2 diabetes mellitus without complications: Secondary | ICD-10-CM | POA: Diagnosis not present

## 2024-02-17 DIAGNOSIS — Z1321 Encounter for screening for nutritional disorder: Secondary | ICD-10-CM | POA: Diagnosis not present

## 2024-02-17 DIAGNOSIS — I10 Essential (primary) hypertension: Secondary | ICD-10-CM | POA: Diagnosis not present

## 2024-02-17 DIAGNOSIS — Z13228 Encounter for screening for other metabolic disorders: Secondary | ICD-10-CM | POA: Diagnosis not present

## 2024-02-17 DIAGNOSIS — E039 Hypothyroidism, unspecified: Secondary | ICD-10-CM | POA: Diagnosis not present
